# Patient Record
Sex: Female | Born: 1985 | Race: White | Hispanic: No | Marital: Married | Smoking: Former smoker
Health system: Southern US, Community
[De-identification: ages and names within clinical notes are randomized; demographics above are authoritative.]

## PROBLEM LIST (undated history)

## (undated) ENCOUNTER — Inpatient Hospital Stay (HOSPITAL_COMMUNITY): Payer: Self-pay

## (undated) DIAGNOSIS — O4420 Partial placenta previa NOS or without hemorrhage, unspecified trimester: Secondary | ICD-10-CM

## (undated) DIAGNOSIS — F329 Major depressive disorder, single episode, unspecified: Secondary | ICD-10-CM

## (undated) DIAGNOSIS — D649 Anemia, unspecified: Secondary | ICD-10-CM

## (undated) DIAGNOSIS — O021 Missed abortion: Secondary | ICD-10-CM

## (undated) DIAGNOSIS — R011 Cardiac murmur, unspecified: Secondary | ICD-10-CM

## (undated) DIAGNOSIS — S069XAA Unspecified intracranial injury with loss of consciousness status unknown, initial encounter: Secondary | ICD-10-CM

## (undated) DIAGNOSIS — R0602 Shortness of breath: Secondary | ICD-10-CM

## (undated) DIAGNOSIS — S069X9A Unspecified intracranial injury with loss of consciousness of unspecified duration, initial encounter: Secondary | ICD-10-CM

## (undated) DIAGNOSIS — J45909 Unspecified asthma, uncomplicated: Secondary | ICD-10-CM

## (undated) DIAGNOSIS — F32A Depression, unspecified: Secondary | ICD-10-CM

## (undated) DIAGNOSIS — J939 Pneumothorax, unspecified: Secondary | ICD-10-CM

## (undated) HISTORY — PX: ANTERIOR CRUCIATE LIGAMENT REPAIR: SHX115

## (undated) HISTORY — PX: OTHER SURGICAL HISTORY: SHX169

---

## 2012-04-14 ENCOUNTER — Emergency Department (HOSPITAL_COMMUNITY): Payer: Self-pay

## 2012-04-14 ENCOUNTER — Emergency Department (HOSPITAL_COMMUNITY)
Admission: EM | Admit: 2012-04-14 | Discharge: 2012-04-14 | Disposition: A | Discharge status: Home / Self Care | Payer: Self-pay | Attending: Emergency Medicine | Admitting: Emergency Medicine

## 2012-04-14 ENCOUNTER — Encounter (HOSPITAL_COMMUNITY): Payer: Self-pay

## 2012-04-14 DIAGNOSIS — R102 Pelvic and perineal pain: Secondary | ICD-10-CM

## 2012-04-14 DIAGNOSIS — N949 Unspecified condition associated with female genital organs and menstrual cycle: Secondary | ICD-10-CM | POA: Insufficient documentation

## 2012-04-14 LAB — PREGNANCY, URINE: Preg Test, Ur: NEGATIVE

## 2012-04-14 LAB — URINALYSIS, ROUTINE W REFLEX MICROSCOPIC
Bilirubin Urine: NEGATIVE
Ketones, ur: NEGATIVE mg/dL
Leukocytes, UA: NEGATIVE
Nitrite: NEGATIVE
Specific Gravity, Urine: 1.025 (ref 1.005–1.030)
Urobilinogen, UA: 0.2 mg/dL (ref 0.0–1.0)

## 2012-04-14 MED ORDER — IBUPROFEN 800 MG PO TABS: 800.0000 mg | ORAL_TABLET | Freq: Three times a day (TID) | ORAL | Status: DC

## 2012-04-14 MED ORDER — SODIUM CHLORIDE 0.9 % IV SOLN
INTRAVENOUS | Status: DC
  Administered 2012-04-14: 03:00:00 via INTRAVENOUS

## 2012-04-14 MED ORDER — ONDANSETRON HCL 4 MG/2ML IJ SOLN
4.0000 mg | Freq: Once | INTRAMUSCULAR | Status: AC
  Administered 2012-04-14: 4 mg via INTRAVENOUS
  Filled 2012-04-14: qty 2

## 2012-04-14 MED ORDER — HYDROMORPHONE HCL PF 1 MG/ML IJ SOLN
1.0000 mg | Freq: Once | INTRAMUSCULAR | Status: AC
  Administered 2012-04-14: 1 mg via INTRAVENOUS
  Filled 2012-04-14: qty 1

## 2012-04-14 MED ORDER — METRONIDAZOLE 500 MG PO TABS: 500.0000 mg | ORAL_TABLET | Freq: Two times a day (BID) | ORAL | Status: DC

## 2012-04-14 MED ORDER — HYDROCODONE-ACETAMINOPHEN 5-500 MG PO TABS: 1.0000 | ORAL_TABLET | Freq: Four times a day (QID) | ORAL | Status: DC | PRN

## 2012-04-14 NOTE — ED Provider Notes (Signed)
History     CSN: [id number]  Arrival date & time 04/14/12  0114   First MD Initiated Contact with Patient 04/14/12 0203      Chief Complaint  Patient presents with  . Abdominal Pain    (Consider location/radiation/quality/duration/timing/severity/associated sxs/prior treatment) HPI Hx per PT. For the last 3 weeks having on and off pelvic pain described as like my insides are being ripped out. No dysuria, no h/o same, has IUD for the last 3 years, denies any vag bleeding or discharge is G1P1, no h/o endometriosis or interstitial cystitis. No lateralizing pain. Tonight became worse and presents here for evaluation. Pain mod to severe and not radiating.  History reviewed. No pertinent past medical history.  Past Surgical History  Procedure Date  . Titanium rod in right femur     No family history on file.  History  Substance Use Topics  . Smoking status: Never Smoker   . Smokeless tobacco: Not on file  . Alcohol Use: Yes     social    OB History    Grav Para Term Preterm Abortions TAB SAB Ect Mult Living                  Review of Systems  Constitutional: Negative for fever and chills.  HENT: Negative for neck pain and neck stiffness.   Eyes: Negative for pain.  Respiratory: Negative for shortness of breath.   Cardiovascular: Negative for chest pain.  Gastrointestinal: Negative for abdominal pain.  Genitourinary: Positive for pelvic pain. Negative for dysuria, vaginal bleeding and vaginal discharge.  Musculoskeletal: Negative for back pain.  Skin: Negative for rash.  Neurological: Negative for headaches.  All other systems reviewed and are negative.    Allergies  Penicillins  Home Medications   Current Outpatient Rx  Name Route Sig Dispense Refill  . LEVONORGESTREL 20 MCG/24HR IU IUD Intrauterine 1 each by Intrauterine route once.      BP 102/65  Pulse 60  Temp 98.2 F (36.8 C) (Oral)  Resp 20  Ht 5\' 3"  (1.6 m)  Wt 135 lb (61.236 kg)  BMI 23.91  kg/m2  SpO2 100%  LMP 03/20/2012  Physical Exam  Constitutional: She is oriented to person, place, and time. She appears well-developed and well-nourished.  HENT:  Head: Normocephalic and atraumatic.  Eyes: EOM are normal. Pupils are equal, round, and reactive to light.  Neck: Neck supple.  Cardiovascular: Regular rhythm and intact distal pulses.   Pulmonary/Chest: Effort normal. No respiratory distress.  Abdominal: Soft. Bowel sounds are normal. She exhibits no distension and no mass. There is no rebound and no guarding.       No CVAT, is tender midline suprapubic. No peritonitis  Musculoskeletal: Normal range of motion. She exhibits no edema.  Neurological: She is alert and oriented to person, place, and time.  Skin: Skin is warm and dry.    ED Course  Pelvic exam Date/Time: 04/14/2012 5:00 AM Performed by: Sunnie Nielsen Authorized by: Sunnie Nielsen Consent: Verbal consent obtained. Risks and benefits: risks, benefits and alternatives were discussed Consent given by: patient Patient understanding: patient states understanding of the procedure being performed Patient consent: the patient's understanding of the procedure matches consent given Procedure consent: procedure consent matches procedure scheduled Required items: required blood products, implants, devices, and special equipment available Patient identity confirmed: verbally with patient Time out: Immediately prior to procedure a "time out" was called to verify the correct patient, procedure, equipment, support staff and site/side marked as required.  Preparation: Patient was prepped and draped in the usual sterile fashion. Patient tolerance: Patient tolerated the procedure well with no immediate complications. Comments: Normal external GU. No rash or lesions. No CMT, mild white discharge no bleeding or adnexal temderness.   (including critical care time)  Results for orders placed during the hospital encounter of 04/14/12    PREGNANCY, URINE      Component Value Range   Preg Test, Ur NEGATIVE  NEGATIVE  URINALYSIS, ROUTINE W REFLEX MICROSCOPIC      Component Value Range   Color, Urine YELLOW  YELLOW   APPearance CLOUDY (*) CLEAR   Specific Gravity, Urine 1.025  1.005 - 1.030   pH 7.5  5.0 - 8.0   Glucose, UA NEGATIVE  NEGATIVE mg/dL   Hgb urine dipstick NEGATIVE  NEGATIVE   Bilirubin Urine NEGATIVE  NEGATIVE   Ketones, ur NEGATIVE  NEGATIVE mg/dL   Protein, ur NEGATIVE  NEGATIVE mg/dL   Urobilinogen, UA 0.2  0.0 - 1.0 mg/dL   Nitrite NEGATIVE  NEGATIVE   Leukocytes, UA NEGATIVE  NEGATIVE  WET PREP, GENITAL      Component Value Range   Yeast Wet Prep HPF POC NONE SEEN  NONE SEEN   Trich, Wet Prep NONE SEEN  NONE SEEN   Clue Cells Wet Prep HPF POC FEW (*) NONE SEEN   WBC, Wet Prep HPF POC FEW (*) NONE SEEN   US Transvaginal Non-ob  04/14/2012  *RADIOLOGY REPORT*  Clinical Data: Back pain.  Pelvic pain.  TRANSABDOMINAL AND TRANSVAGINAL ULTRASOUND OF PELVIS Technique:  Both transabdominal and transvaginal ultrasound examinations of the pelvis were performed. Transabdominal technique was performed for global imaging of the pelvis including uterus, ovaries, adnexal regions, and pelvic cul-de-sac.  It was necessary to proceed with endovaginal exam following the transabdominal exam to visualize the IUD and adnexa.  Comparison:  None  Findings:  Uterus: Normal uterus.  7.5 cm x 3.3 x 5.5 cm.  Normal myometrial echotexture.  Endometrium: IUD.  Endometrial thickness 3 mm.  Right ovary:  32 mm x 22 mm x 23 mm within normal physiologic appearance.  Left ovary: Not visualized due to bowel gas and bowel loop interposed.  Other findings: No free fluid  IMPRESSION: Normal study aside from nonvisualization of the left ovary due to overlying bowel gas.  The IUD is well positioned in the uterus.   Original Report Authenticated By: Andreas Newport, M.D.    US Pelvis Complete  04/14/2012  *RADIOLOGY REPORT*  Clinical Data: Back  pain.  Pelvic pain.  TRANSABDOMINAL AND TRANSVAGINAL ULTRASOUND OF PELVIS Technique:  Both transabdominal and transvaginal ultrasound examinations of the pelvis were performed. Transabdominal technique was performed for global imaging of the pelvis including uterus, ovaries, adnexal regions, and pelvic cul-de-sac.  It was necessary to proceed with endovaginal exam following the transabdominal exam to visualize the IUD and adnexa.  Comparison:  None  Findings:  Uterus: Normal uterus.  7.5 cm x 3.3 x 5.5 cm.  Normal myometrial echotexture.  Endometrium: IUD.  Endometrial thickness 3 mm.  Right ovary:  32 mm x 22 mm x 23 mm within normal physiologic appearance.  Left ovary: Not visualized due to bowel gas and bowel loop interposed.  Other findings: No free fluid  IMPRESSION: Normal study aside from nonvisualization of the left ovary due to overlying bowel gas.  The IUD is well positioned in the uterus.   Original Report Authenticated By: Andreas Newport, M.D.    Pain control IV Dilaudid. [place]  for pelvic pain reviewed as above. UA reviewed no UTI. Wet prep few clue cells.  Plan flagyl and GYN follow up. PT states understanding pelvic pain precautions. Stable for d./c home with improved ABD exam: s/nt/nd remains no peritonitis  MDM   Otherwise healthy female with pelvic pain and work up as above. [place]. UA. Wet prep. Pending GC/Chlamydia. Improved with IV narcotics. VS and nursing notes reviewed.         Sunnie Nielsen, MD 04/14/12 [phone]

## 2012-04-14 NOTE — ED Notes (Signed)
Pt states she has been having crampy pelvic pain intermittently over the last 2 weeks-states at midnight tonight, she developed severe cramping in her pelvic area and states "it felt like someone was tearing my insides out"-denies any bleeding.  LMP at the beginning of the month was normal.  Has a Murena in for the past 3 years.  States pain intermittently with urination.

## 2012-04-14 NOTE — ED Notes (Signed)
Pt set up for pelvic. Dr. Dierdre Highman notified

## 2012-04-15 LAB — GC/CHLAMYDIA PROBE AMP, GENITAL: Chlamydia, DNA Probe: NEGATIVE

## 2013-08-23 ENCOUNTER — Inpatient Hospital Stay (HOSPITAL_COMMUNITY)
Admission: AD | Admit: 2013-08-23 | Discharge: 2013-08-23 | Disposition: A | Discharge status: Home / Self Care | Payer: 59 | Source: Ambulatory Visit | Attending: Obstetrics & Gynecology | Admitting: Obstetrics & Gynecology

## 2013-08-23 ENCOUNTER — Encounter (HOSPITAL_COMMUNITY): Payer: Self-pay | Admitting: *Deleted

## 2013-08-23 DIAGNOSIS — Z3200 Encounter for pregnancy test, result unknown: Secondary | ICD-10-CM | POA: Insufficient documentation

## 2013-08-23 DIAGNOSIS — R509 Fever, unspecified: Secondary | ICD-10-CM | POA: Insufficient documentation

## 2013-08-23 DIAGNOSIS — R0602 Shortness of breath: Secondary | ICD-10-CM | POA: Insufficient documentation

## 2013-08-23 DIAGNOSIS — R51 Headache: Secondary | ICD-10-CM | POA: Insufficient documentation

## 2013-08-23 DIAGNOSIS — Z3201 Encounter for pregnancy test, result positive: Secondary | ICD-10-CM

## 2013-08-23 HISTORY — DX: Cardiac murmur, unspecified: R01.1

## 2013-08-23 HISTORY — DX: Unspecified intracranial injury with loss of consciousness of unspecified duration, initial encounter: S06.9X9A

## 2013-08-23 HISTORY — DX: Pneumothorax, unspecified: J93.9

## 2013-08-23 HISTORY — DX: Unspecified intracranial injury with loss of consciousness status unknown, initial encounter: S06.9XAA

## 2013-08-23 LAB — URINALYSIS, ROUTINE W REFLEX MICROSCOPIC
BILIRUBIN URINE: NEGATIVE
GLUCOSE, UA: NEGATIVE mg/dL
HGB URINE DIPSTICK: NEGATIVE
KETONES UR: NEGATIVE mg/dL
Leukocytes, UA: NEGATIVE
Nitrite: NEGATIVE
PROTEIN: NEGATIVE mg/dL
Specific Gravity, Urine: >1.03 — ABNORMAL HIGH (ref 1.005–1.030)
UROBILINOGEN UA: 0.2 mg/dL (ref 0.0–1.0)
pH: 6 (ref 5.0–8.0)

## 2013-08-23 LAB — POCT PREGNANCY, URINE: Preg Test, Ur: POSITIVE — AB

## 2013-08-23 NOTE — MAU Note (Signed)
Pt states she had a sore throat this a.m., not now.  Denies any cough.

## 2013-08-23 NOTE — MAU Note (Signed)
Pt reports she has had a "fever" for the past 3 days. Has not taken temp but husband stated she has "felt hot" off and on for the past 3 days. Has has occasional chills.

## 2013-08-23 NOTE — Discharge Instructions (Signed)
Pregnancy, First Trimester °The first trimester is the first 3 months your baby is growing inside you. It is important to follow your doctor's instructions. °HOME CARE  °· Do not smoke. °· Do not drink alcohol. °· Only take medicine as told by your doctor. °· Exercise. °· Eat healthy foods. Eat regular, well-balanced meals. °· You can have sex (intercourse) if there are no other problems with the pregnancy. °· Things that help with morning sickness: °· Eat soda crackers before getting up in the morning. °· Eat 4 to 5 small meals rather than 3 large meals. °· Drink liquids between meals, not during meals. °· Go to all appointments as told. °· Take all vitamins or supplements as told by your doctor. °GET HELP RIGHT AWAY IF:  °· You develop a fever. °· You have a bad smelling fluid that is leaking from your vagina. °· There is bleeding from the vagina. °· You develop severe belly (abdominal) or back pain. °· You throw up (vomit) blood. It may look like coffee grounds. °· You lose more than 2 pounds in a week. °· You gain 5 pounds or more in a week. °· You gain more than 2 pounds in a week and you see puffiness (swelling) in your feet, ankles, or legs. °· You have severe dizziness or pass out (faint). °· You are around people who have German measles, chickenpox, or fifth disease. °· You have a headache, watery poop (diarrhea), pain with peeing (urinating), or cannot breath right. °Document Released: 12/22/2007 Document Revised: 09/27/2011 Document Reviewed: 12/22/2007 °ExitCare® Patient Information ©2014 [employer]. ° °
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
## 2013-08-28 ENCOUNTER — Ambulatory Visit: Payer: 59

## 2013-09-13 ENCOUNTER — Encounter (HOSPITAL_COMMUNITY): Payer: Self-pay

## 2013-09-13 ENCOUNTER — Inpatient Hospital Stay (HOSPITAL_COMMUNITY): Payer: 59

## 2013-09-13 ENCOUNTER — Inpatient Hospital Stay (HOSPITAL_COMMUNITY)
Admission: AD | Admit: 2013-09-13 | Discharge: 2013-09-14 | Disposition: A | Discharge status: Home / Self Care | Payer: 59 | Source: Ambulatory Visit | Attending: Obstetrics and Gynecology | Admitting: Obstetrics and Gynecology

## 2013-09-13 DIAGNOSIS — R109 Unspecified abdominal pain: Secondary | ICD-10-CM | POA: Insufficient documentation

## 2013-09-13 DIAGNOSIS — O209 Hemorrhage in early pregnancy, unspecified: Secondary | ICD-10-CM | POA: Insufficient documentation

## 2013-09-13 NOTE — MAU Note (Signed)
Pt reports vaginal bleeding since 6pm, mild cramping
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
## 2013-09-14 ENCOUNTER — Inpatient Hospital Stay (HOSPITAL_COMMUNITY): Payer: 59

## 2013-09-14 DIAGNOSIS — O209 Hemorrhage in early pregnancy, unspecified: Secondary | ICD-10-CM

## 2013-09-14 LAB — GC/CHLAMYDIA PROBE AMP
CT PROBE, AMP APTIMA: NEGATIVE
GC Probe RNA: NEGATIVE

## 2013-09-14 LAB — URINALYSIS, ROUTINE W REFLEX MICROSCOPIC
BILIRUBIN URINE: NEGATIVE
GLUCOSE, UA: NEGATIVE mg/dL
KETONES UR: NEGATIVE mg/dL
Leukocytes, UA: NEGATIVE
Nitrite: NEGATIVE
PH: 6 (ref 5.0–8.0)
Protein, ur: NEGATIVE mg/dL
SPECIFIC GRAVITY, URINE: 1.025 (ref 1.005–1.030)
Urobilinogen, UA: 0.2 mg/dL (ref 0.0–1.0)

## 2013-09-14 LAB — URINE MICROSCOPIC-ADD ON

## 2013-09-14 LAB — CBC
HEMATOCRIT: 35.6 % — AB (ref 36.0–46.0)
HEMOGLOBIN: 11.6 g/dL — AB (ref 12.0–15.0)
MCH: 30.8 pg (ref 26.0–34.0)
MCHC: 32.6 g/dL (ref 30.0–36.0)
MCV: 94.4 fL (ref 78.0–100.0)
Platelets: 258 10*3/uL (ref 150–400)
RBC: 3.77 MIL/uL — AB (ref 3.87–5.11)
RDW: 13.3 % (ref 11.5–15.5)
WBC: 7 10*3/uL (ref 4.0–10.5)

## 2013-09-14 LAB — WET PREP, GENITAL
CLUE CELLS WET PREP: NONE SEEN
Trich, Wet Prep: NONE SEEN
Yeast Wet Prep HPF POC: NONE SEEN

## 2013-09-14 LAB — HCG, QUANTITATIVE, PREGNANCY: hCG, Beta Chain, Quant, S: 3549 m[IU]/mL — ABNORMAL HIGH (ref <5)

## 2013-09-14 NOTE — Discharge Instructions (Signed)
Vaginal Bleeding During Pregnancy, First Trimester °A small amount of bleeding (spotting) from the vagina is relatively common in early pregnancy. It usually stops on its own. Various things may cause bleeding or spotting in early pregnancy. Some bleeding may be related to the pregnancy, and some may not. In most cases, the bleeding is normal and is not a problem. However, bleeding can also be a sign of something serious. Be sure to tell your health care provider about any vaginal bleeding right away. °Some possible causes of vaginal bleeding during the first trimester include: °· Infection or inflammation of the cervix. °· Growths (polyps) on the cervix. °· Miscarriage or threatened miscarriage. °· Pregnancy tissue has developed outside of the uterus and in a fallopian tube (tubal pregnancy). °· Tiny cysts have developed in the uterus instead of pregnancy tissue (molar pregnancy). °HOME CARE INSTRUCTIONS  °Watch your condition for any changes. The following actions may help to lessen any discomfort you are feeling: °· Follow your health care provider's instructions for limiting your activity. If your health care provider orders bed rest, you may need to stay in bed and only get up to use the bathroom. However, your health care provider may allow you to continue light activity. °· If needed, make plans for someone to help with your regular activities and responsibilities while you are on bed rest. °· Keep track of the number of pads you use each [place], how often you change pads, and how soaked (saturated) they are. Write this down. °· Do not use tampons. Do not douche. °· Do not have sexual intercourse or orgasms until approved by your health care provider. °· If you pass any tissue from your vagina, save the tissue so you can show it to your health care provider. °· Only take over-the-counter or prescription medicines as directed by your health care provider. °· Do not take aspirin because it can make you  bleed. °· Keep all follow-up appointments as directed by your health care provider. °SEEK MEDICAL CARE IF: °· You have any vaginal bleeding during any part of your pregnancy. °· You have cramps or labor pains. °SEEK IMMEDIATE MEDICAL CARE IF:  °· You have severe cramps in your back or belly (abdomen). °· You have a fever, not controlled by medicine. °· You pass large clots or tissue from your vagina. °· Your bleeding increases. °· You feel lightheaded or weak, or you have fainting episodes. °· You have chills. °· You are leaking fluid or have a gush of fluid from your vagina. °· You pass out while having a bowel movement. °MAKE SURE YOU: °· Understand these instructions. °· Will watch your condition. °· Will get help right away if you are not doing well or get worse. °Document Released: 04/14/2005 Document Revised: 04/25/2013 Document Reviewed: 03/12/2013 °ExitCare® Patient Information ©2014 [employer]. ° °

## 2013-10-02 ENCOUNTER — Encounter (HOSPITAL_COMMUNITY): Payer: Self-pay

## 2013-10-03 ENCOUNTER — Encounter (HOSPITAL_COMMUNITY): Payer: Self-pay | Admitting: Obstetrics and Gynecology

## 2013-10-03 ENCOUNTER — Encounter: Payer: 59 | Admitting: Family Medicine

## 2013-10-03 DIAGNOSIS — O021 Missed abortion: Secondary | ICD-10-CM

## 2013-10-03 HISTORY — DX: Missed abortion: O02.1
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
## 2013-10-04 ENCOUNTER — Encounter (HOSPITAL_COMMUNITY): Payer: 59 | Admitting: Anesthesiology

## 2013-10-04 ENCOUNTER — Encounter (HOSPITAL_COMMUNITY)
Admission: RE | Disposition: A | Discharge status: Home / Self Care | Payer: Self-pay | Source: Ambulatory Visit | Attending: Obstetrics and Gynecology

## 2013-10-04 ENCOUNTER — Ambulatory Visit (HOSPITAL_COMMUNITY): Payer: 59 | Admitting: Anesthesiology

## 2013-10-04 ENCOUNTER — Encounter (HOSPITAL_COMMUNITY): Payer: Self-pay

## 2013-10-04 ENCOUNTER — Ambulatory Visit (HOSPITAL_COMMUNITY)
Admission: RE | Admit: 2013-10-04 | Discharge: 2013-10-04 | Disposition: A | Discharge status: Home / Self Care | Payer: 59 | Source: Ambulatory Visit | Attending: Obstetrics and Gynecology | Admitting: Obstetrics and Gynecology

## 2013-10-04 DIAGNOSIS — R011 Cardiac murmur, unspecified: Secondary | ICD-10-CM | POA: Insufficient documentation

## 2013-10-04 DIAGNOSIS — R0602 Shortness of breath: Secondary | ICD-10-CM | POA: Insufficient documentation

## 2013-10-04 DIAGNOSIS — O021 Missed abortion: Secondary | ICD-10-CM | POA: Diagnosis present

## 2013-10-04 HISTORY — DX: Missed abortion: O02.1

## 2013-10-04 HISTORY — DX: Depression, unspecified: F32.A

## 2013-10-04 HISTORY — DX: Major depressive disorder, single episode, unspecified: F32.9

## 2013-10-04 HISTORY — PX: DILATION AND EVACUATION: SHX1459

## 2013-10-04 HISTORY — DX: Shortness of breath: R06.02

## 2013-10-04 LAB — CBC
HCT: 38.8 % (ref 36.0–46.0)
HEMOGLOBIN: 13 g/dL (ref 12.0–15.0)
MCH: 31.3 pg (ref 26.0–34.0)
MCHC: 33.5 g/dL (ref 30.0–36.0)
MCV: 93.3 fL (ref 78.0–100.0)
Platelets: 302 10*3/uL (ref 150–400)
RBC: 4.16 MIL/uL (ref 3.87–5.11)
RDW: 13.3 % (ref 11.5–15.5)
WBC: 8 10*3/uL (ref 4.0–10.5)

## 2013-10-04 LAB — ABO/RH: ABO/RH(D): A POS

## 2013-10-04 SURGERY — DILATION AND EVACUATION, UTERUS
Anesthesia: Monitor Anesthesia Care | Site: Uterus

## 2013-10-04 MED ORDER — FENTANYL CITRATE 0.05 MG/ML IJ SOLN
INTRAMUSCULAR | Status: AC
  Filled 2013-10-04: qty 5

## 2013-10-04 MED ORDER — OXYCODONE-ACETAMINOPHEN 5-325 MG PO TABS: 1.0000 | ORAL_TABLET | Freq: Four times a day (QID) | ORAL | Status: DC | PRN

## 2013-10-04 MED ORDER — LACTATED RINGERS IV SOLN: INTRAVENOUS | Status: DC

## 2013-10-04 MED ORDER — LACTATED RINGERS IV SOLN
INTRAVENOUS | Status: DC
  Administered 2013-10-04 (×2): via INTRAVENOUS

## 2013-10-04 MED ORDER — ONDANSETRON HCL 4 MG/2ML IJ SOLN
INTRAMUSCULAR | Status: AC
  Filled 2013-10-04: qty 2

## 2013-10-04 MED ORDER — KETOROLAC TROMETHAMINE 30 MG/ML IJ SOLN: 15.0000 mg | Freq: Once | INTRAMUSCULAR | Status: DC | PRN

## 2013-10-04 MED ORDER — PROPOFOL 10 MG/ML IV EMUL
INTRAVENOUS | Status: AC
  Filled 2013-10-04: qty 40

## 2013-10-04 MED ORDER — MIDAZOLAM HCL 2 MG/2ML IJ SOLN
INTRAMUSCULAR | Status: DC | PRN
  Administered 2013-10-04: 1 mg via INTRAVENOUS
  Administered 2013-10-04: 2 mg via INTRAVENOUS

## 2013-10-04 MED ORDER — LIDOCAINE HCL (CARDIAC) 20 MG/ML IV SOLN
INTRAVENOUS | Status: AC
  Filled 2013-10-04: qty 5

## 2013-10-04 MED ORDER — KETOROLAC TROMETHAMINE 30 MG/ML IJ SOLN
INTRAMUSCULAR | Status: AC
  Filled 2013-10-04: qty 1

## 2013-10-04 MED ORDER — MIDAZOLAM HCL 2 MG/2ML IJ SOLN: 0.5000 mg | Freq: Once | INTRAMUSCULAR | Status: DC | PRN

## 2013-10-04 MED ORDER — FENTANYL CITRATE 0.05 MG/ML IJ SOLN
INTRAMUSCULAR | Status: DC | PRN
  Administered 2013-10-04: 50 ug via INTRAVENOUS
  Administered 2013-10-04 (×4): 25 ug via INTRAVENOUS

## 2013-10-04 MED ORDER — ONDANSETRON HCL 4 MG/2ML IJ SOLN
INTRAMUSCULAR | Status: DC | PRN
  Administered 2013-10-04: 4 mg via INTRAVENOUS

## 2013-10-04 MED ORDER — FENTANYL CITRATE 0.05 MG/ML IJ SOLN
25.0000 ug | INTRAMUSCULAR | Status: DC | PRN
  Administered 2013-10-04: 50 ug via INTRAVENOUS

## 2013-10-04 MED ORDER — DOXYCYCLINE HYCLATE 100 MG PO TABS
ORAL_TABLET | ORAL | Status: AC
  Administered 2013-10-04: 200 mg via ORAL
  Filled 2013-10-04: qty 2

## 2013-10-04 MED ORDER — IBUPROFEN 800 MG PO TABS: 800.0000 mg | ORAL_TABLET | Freq: Three times a day (TID) | ORAL | Status: DC | PRN

## 2013-10-04 MED ORDER — OXYCODONE-ACETAMINOPHEN 5-325 MG PO TABS
ORAL_TABLET | ORAL | Status: AC
  Administered 2013-10-04: 1
  Filled 2013-10-04: qty 1

## 2013-10-04 MED ORDER — MIDAZOLAM HCL 2 MG/2ML IJ SOLN
INTRAMUSCULAR | Status: AC
Start: 2013-10-04 — End: 2013-10-04
  Filled 2013-10-04: qty 2

## 2013-10-04 MED ORDER — KETOROLAC TROMETHAMINE 30 MG/ML IJ SOLN
INTRAMUSCULAR | Status: DC | PRN
  Administered 2013-10-04: 30 mg via INTRAVENOUS

## 2013-10-04 MED ORDER — MEPERIDINE HCL 25 MG/ML IJ SOLN: 6.2500 mg | INTRAMUSCULAR | Status: DC | PRN

## 2013-10-04 MED ORDER — LIDOCAINE HCL (CARDIAC) 20 MG/ML IV SOLN
INTRAVENOUS | Status: DC | PRN
  Administered 2013-10-04: 60 mg via INTRAVENOUS

## 2013-10-04 MED ORDER — OXYCODONE-ACETAMINOPHEN 5-325 MG PO TABS: 1.0000 | ORAL_TABLET | ORAL | Status: DC | PRN

## 2013-10-04 MED ORDER — DOXYCYCLINE HYCLATE 100 MG PO TABS
200.0000 mg | ORAL_TABLET | Freq: Once | ORAL | Status: AC
  Administered 2013-10-04: 200 mg via ORAL

## 2013-10-04 MED ORDER — FENTANYL CITRATE 0.05 MG/ML IJ SOLN
INTRAMUSCULAR | Status: AC
  Filled 2013-10-04: qty 2

## 2013-10-04 MED ORDER — MIDAZOLAM HCL 2 MG/2ML IJ SOLN
INTRAMUSCULAR | Status: AC
  Filled 2013-10-04: qty 2

## 2013-10-04 MED ORDER — PROPOFOL INFUSION 10 MG/ML OPTIME
INTRAVENOUS | Status: DC | PRN
  Administered 2013-10-04: 50 ug/kg/min via INTRAVENOUS

## 2013-10-04 MED ORDER — LIDOCAINE HCL 2 % IJ SOLN
INTRAMUSCULAR | Status: AC
  Filled 2013-10-04: qty 20

## 2013-10-04 MED ORDER — LIDOCAINE HCL 2 % IJ SOLN
INTRAMUSCULAR | Status: DC | PRN
  Administered 2013-10-04: 16 mL

## 2013-10-04 MED ORDER — PROMETHAZINE HCL 25 MG/ML IJ SOLN: 6.2500 mg | INTRAMUSCULAR | Status: DC | PRN

## 2013-10-04 SURGICAL SUPPLY — 19 items
CATH ROBINSON RED A/P 16FR (CATHETERS) ×4 IMPLANT
CLOTH BEACON ORANGE TIMEOUT ST (SAFETY) ×4 IMPLANT
DECANTER SPIKE VIAL GLASS SM (MISCELLANEOUS) ×4 IMPLANT
DRSG TELFA 3X8 NADH (GAUZE/BANDAGES/DRESSINGS) ×4 IMPLANT
GLOVE BIO SURGEON STRL SZ 6.5 (GLOVE) ×3 IMPLANT
GLOVE BIO SURGEON STRL SZ7 (GLOVE) ×4 IMPLANT
GLOVE BIO SURGEONS STRL SZ 6.5 (GLOVE) ×1
GLOVE BIOGEL PI IND STRL 6.5 (GLOVE) ×4 IMPLANT
GLOVE BIOGEL PI INDICATOR 6.5 (GLOVE) ×4
GLOVE ECLIPSE 6.0 STRL STRAW (GLOVE) ×8 IMPLANT
GLOVE SURG SS PI 7.0 STRL IVOR (GLOVE) ×20 IMPLANT
GOWN STRL REUS W/TWL LRG LVL3 (GOWN DISPOSABLE) ×12 IMPLANT
NEEDLE SPNL 22GX3.5 QUINCKE BK (NEEDLE) ×4 IMPLANT
PACK VAGINAL MINOR WOMEN LF (CUSTOM PROCEDURE TRAY) ×4 IMPLANT
PAD OB MATERNITY 4.3X12.25 (PERSONAL CARE ITEMS) ×4 IMPLANT
PAD PREP 24X48 CUFFED NSTRL (MISCELLANEOUS) ×4 IMPLANT
SYR CONTROL 10ML LL (SYRINGE) ×4 IMPLANT
TOWEL OR 17X24 6PK STRL BLUE (TOWEL DISPOSABLE) ×8 IMPLANT
VACURETTE 7MM CVD STRL WRAP (CANNULA) ×4 IMPLANT

## 2013-10-04 NOTE — Transfer of Care (Signed)
Immediate Anesthesia Transfer of Care Note  Patient: [name]  Procedure(s) Performed: Procedure(s): DILATATION AND EVACUATION (N/A)  Patient Location: PACU  Anesthesia Type:MAC  Level of Consciousness: awake, alert , oriented and patient cooperative  Airway & Oxygen Therapy: Patient Spontanous Breathing and Patient connected to nasal cannula oxygen  Post-op Assessment: Report given to PACU RN and Post -op Vital signs reviewed and stable  Post vital signs: Reviewed and stable  Complications: No apparent anesthesia complications

## 2013-10-04 NOTE — Interval H&P Note (Signed)
History and Physical Interval Note:  10/04/2013 9:20 AM  [name]  has presented today for surgery, with the diagnosis of PROC, [id number]  The various methods of treatment have been discussed with the patient and family. After consideration of risks, benefits and other options for treatment, the patient has consented to  Procedure(s): DILATATION AND CURETTAGE (N/A) as a surgical intervention .  The patient's history has been reviewed, patient examined, no change in status, stable for surgery.  I have reviewed the patient's chart and labs.  Questions were answered to the patient's satisfaction.     BOVARD,Annamarie Yamaguchi   

## 2013-10-04 NOTE — Anesthesia Postprocedure Evaluation (Signed)
  Anesthesia Post Note  Patient: [name]  Procedure(s) Performed: Procedure(s) (LRB): DILATATION AND EVACUATION (N/A)  Anesthesia type: MAC  Patient location: PACU  Post pain: Pain level controlled  Post assessment: Post-op Vital signs reviewed  Last Vitals:  Filed Vitals:   10/04/13 1054  BP:   Pulse: 79  Temp: 36.5 C  Resp: 12    Post vital signs: Reviewed  Level of consciousness: sedated  Complications: No apparent anesthesia complications,a

## 2013-10-04 NOTE — Anesthesia Preprocedure Evaluation (Addendum)
Anesthesia Evaluation  Patient identified by MRN, date of birth, ID band Patient awake    Reviewed: Allergy & Precautions, H&P , Patient's Chart, lab work & pertinent test results, reviewed documented beta blocker date and time   History of Anesthesia Complications Negative for: history of anesthetic complications  Airway Mallampati: II TM Distance: >3 FB Neck ROM: full    Dental   Pulmonary shortness of breath,  breath sounds clear to auscultation        Cardiovascular Exercise Tolerance: Good + Valvular Problems/Murmurs Rhythm:regular Rate:Normal + Systolic murmurs    Neuro/Psych PSYCHIATRIC DISORDERS negative psych ROS   GI/Hepatic   Endo/Other    Renal/GU      Musculoskeletal   Abdominal   Peds  Hematology   Anesthesia Other Findings   Reproductive/Obstetrics                           Anesthesia Physical Anesthesia Plan  ASA: II  Anesthesia Plan: MAC   Post-op Pain Management:    Induction:   Airway Management Planned:   Additional Equipment:   Intra-op Plan:   Post-operative Plan:   Informed Consent: I have reviewed the patients History and Physical, chart, labs and discussed the procedure including the risks, benefits and alternatives for the proposed anesthesia with the patient or authorized representative who has indicated his/her understanding and acceptance.   Dental Advisory Given  Plan Discussed with: CRNA, Surgeon and Anesthesiologist  Anesthesia Plan Comments:        very very mild flow murmur.  Good exercise tolerance.  Anesthesia Quick Evaluation

## 2013-10-04 NOTE — Discharge Instructions (Signed)
DISCHARGE INSTRUCTIONS: D&C / D&E °The following instructions have been prepared to help you care for yourself upon your return home. °  °Personal hygiene: °• Use sanitary pads for vaginal drainage, not tampons. °• Shower the [place] after your procedure. °• NO tub baths, pools or Jacuzzis for 2-3 weeks. °• Wipe front to back after using the bathroom. ° °Activity and limitations: °• Do NOT drive or operate any equipment for 24 hours. The effects of anesthesia are still present and drowsiness may result. °• Do NOT rest in bed all Wall. °• Walking is encouraged. °• Walk up and down stairs slowly. °• You may resume your normal activity in one to two days or as indicated by your physician. ° °Sexual activity: NO intercourse for at least 2 weeks after the procedure, or as indicated by your physician. ° °Diet: Eat a light meal as desired this evening. You may resume your usual diet tomorrow. ° °Return to work: You may resume your work activities in one to two days or as indicated by your doctor. ° °What to expect after your surgery: Expect to have vaginal bleeding/discharge for 2-3 days and spotting for up to 10 days. It is not unusual to have soreness for up to 1-2 weeks. You may have a slight burning sensation when you urinate for the first [place]. Mild cramps may continue for a couple of days. You may have a regular period in 2-6 weeks. ° °Call your doctor for any of the following: °• Excessive vaginal bleeding, saturating and changing one pad every hour. °• Inability to urinate 6 hours after discharge from hospital. °• Pain not relieved by pain medication. °• Fever of 100.4° F or greater. °• Unusual vaginal discharge or odor. ° ° Call for an appointment:  ° ° °Patient’s signature: ______________________ ° °Nurse’s signature ________________________ ° °Support person's signature_______________________ ° ° ° °

## 2013-10-04 NOTE — Interval H&P Note (Signed)
History and Physical Interval Note:  10/04/2013 9:20 AM  Shanda BumpsJessica Bauman  has presented today for surgery, with the diagnosis of PROC, [phone]  The various methods of treatment have been discussed with the patient and family. After consideration of risks, benefits and other options for treatment, the patient has consented to  Procedure(s): DILATATION AND CURETTAGE (N/A) as a surgical intervention .  The patient's history has been reviewed, patient examined, no change in status, stable for surgery.  I have reviewed the patient's chart and labs.  Questions were answered to the patient's satisfaction.     BOVARD,Allaina Brotzman

## 2013-10-04 NOTE — Op Note (Signed)
NAMIrwin Brakeman:  Lewis, Mariah                 ACCOUNT NO.:  [id number]  MEDICAL RECORD NO.:  [id number]  LOCATION:  [facility]                          F[facility]ILITY:  [facility]  PHYSICIAN:  Sherron MondayJody Bovard, MD        DATE OF BIRTH:  Sep 18, 1985  DATE OF PROCEDURE:  10/04/2013 DATE OF DISCHARGE:                              OPERATIVE REPORT   PREOPERATIVE DIAGNOSIS:  Retained products of conception after missed abortion with pelvic pain.  POSTOPERATIVE DIAGNOSIS:  Retained products of conception after missed abortion with pelvic pain.  PROCEDURE:  Suction, D and E.  SURGEON:  Sherron MondayJody Bovard, MD.  ANESTHESIA:  IV sedation and paracervical block.  Local anesthetic, paracervical block with 2% lidocaine 16 mL at 12, 5, and 7 o'clock.  ESTIMATED BLOOD LOSS:  Less than 50 mL.  PATHOLOGY:  Products of conception.  COMPLICATIONS:  None.  PROCEDURE IN DETAIL:  After informed consent was reviewed with the patient and her partner, she was transported to the OR, placed on the table in supine position.  General anesthesia was induced and found to be adequate.  IV sedation was induced and found to be adequate.  She was then placed in the elephant stirrups, prepped and draped in the normal sterile fashion.  Her bladder was sterilely drained.  An open-sided speculum was used to easily visualize her cervix.  The anterior lip was grasped with a single-tooth tenaculum and 16 mL of 2% lidocaine were used for paracervical block as described above.  The uterus were sounded to approximately 6.5 cm.  The cervix did not require dilatation.  A 7- French curette was placed without complication.  Curettage was performed with a suction curette.  Her uterus was approximately 3 passes yielding a small amount of tissue.  The patient tolerated the procedure well, was awakened in stable condition.  Sponge, lap, and needle counts were correct x2 per the operating room staff.     Sherron MondayJody Bovard, MD     JB/MEDQ  D:  10/04/2013  T:   10/04/2013  Job:  [id number]

## 2013-10-04 NOTE — Brief Op Note (Signed)
10/04/2013  10:01 AM  PATIENT:  [name]  28 y.o. female  PRE-OPERATIVE DIAGNOSIS:  retained products after MAB, with pain  POST-OPERATIVE DIAGNOSIS:  retained products after MAB, with pain  PROCEDURE:  Procedure(s): DILATATION AND CURETTAGE with suction  (N/A)  SURGEON:  Surgeon(s) and Role:    * Xzavior Reinig Bovard, MD - Primary  ANESTHESIA:   IV sedation and paracervical block  EBL:   <50cc, min  BLOOD ADMINISTERED:none  DRAINS: none   LOCAL MEDICATIONS USED:  LIDOCAINE 2% and Amount: 16 ml  SPECIMEN:  Source of Specimen:  POC  DISPOSITION OF SPECIMEN:  PATHOLOGY  COUNTS:  YES  TOURNIQUET:  * No tourniquets in log *  DICTATION: .Other Dictation: Dictation Number [id number]  PLAN OF CARE: Discharge to home after PACU  PATIENT DISPOSITION:  PACU - hemodynamically stable.   Delay start of Pharmacological VTE agent (>24hrs) due to surgical blood loss or risk of bleeding: not applicable

## 2013-10-05 ENCOUNTER — Encounter (HOSPITAL_COMMUNITY): Payer: Self-pay | Admitting: Obstetrics and Gynecology

## 2013-10-10 ENCOUNTER — Encounter (HOSPITAL_COMMUNITY): Payer: Self-pay | Admitting: *Deleted

## 2013-10-10 ENCOUNTER — Inpatient Hospital Stay (HOSPITAL_COMMUNITY)
Admission: AD | Admit: 2013-10-10 | Discharge: 2013-10-10 | Disposition: A | Discharge status: Home / Self Care | Payer: 59 | Source: Ambulatory Visit | Attending: Obstetrics and Gynecology | Admitting: Obstetrics and Gynecology

## 2013-10-10 ENCOUNTER — Inpatient Hospital Stay (HOSPITAL_COMMUNITY): Payer: 59

## 2013-10-10 DIAGNOSIS — R071 Chest pain on breathing: Secondary | ICD-10-CM | POA: Insufficient documentation

## 2013-10-10 DIAGNOSIS — R0602 Shortness of breath: Secondary | ICD-10-CM | POA: Insufficient documentation

## 2013-10-10 DIAGNOSIS — R062 Wheezing: Secondary | ICD-10-CM | POA: Insufficient documentation

## 2013-10-10 DIAGNOSIS — J45909 Unspecified asthma, uncomplicated: Secondary | ICD-10-CM

## 2013-10-10 DIAGNOSIS — R0781 Pleurodynia: Secondary | ICD-10-CM

## 2013-10-10 LAB — CBC
HCT: 37.5 % (ref 36.0–46.0)
HEMOGLOBIN: 12.3 g/dL (ref 12.0–15.0)
MCH: 31.1 pg (ref 26.0–34.0)
MCHC: 32.8 g/dL (ref 30.0–36.0)
MCV: 94.7 fL (ref 78.0–100.0)
PLATELETS: 302 10*3/uL (ref 150–400)
RBC: 3.96 MIL/uL (ref 3.87–5.11)
RDW: 13.2 % (ref 11.5–15.5)
WBC: 8.3 10*3/uL (ref 4.0–10.5)

## 2013-10-10 MED ORDER — FLUTICASONE PROPIONATE HFA 44 MCG/ACT IN AERO: 2.0000 | INHALATION_SPRAY | Freq: Two times a day (BID) | RESPIRATORY_TRACT | Status: DC

## 2013-10-10 MED ORDER — IPRATROPIUM BROMIDE 0.02 % IN SOLN
0.5000 mg | Freq: Once | RESPIRATORY_TRACT | Status: AC
  Administered 2013-10-10: 0.5 mg via RESPIRATORY_TRACT
  Filled 2013-10-10: qty 2.5

## 2013-10-10 MED ORDER — ALBUTEROL SULFATE (2.5 MG/3ML) 0.083% IN NEBU
2.5000 mg | INHALATION_SOLUTION | Freq: Once | RESPIRATORY_TRACT | Status: AC
  Administered 2013-10-10: 2.5 mg via RESPIRATORY_TRACT
  Filled 2013-10-10: qty 3

## 2013-10-10 MED ORDER — KETOROLAC TROMETHAMINE 60 MG/2ML IM SOLN
60.0000 mg | Freq: Once | INTRAMUSCULAR | Status: AC
  Administered 2013-10-10: 60 mg via INTRAMUSCULAR
  Filled 2013-10-10: qty 2

## 2013-10-10 MED ORDER — ALBUTEROL SULFATE HFA 108 (90 BASE) MCG/ACT IN AERS
1.0000 | INHALATION_SPRAY | Freq: Once | RESPIRATORY_TRACT | Status: AC
  Administered 2013-10-10: 2 via RESPIRATORY_TRACT
  Filled 2013-10-10: qty 6.7

## 2013-10-10 NOTE — Discharge Instructions (Signed)
Asthma, Adult Asthma is a recurring condition in which the airways tighten and narrow. Asthma can make it difficult to breathe. It can cause coughing, wheezing, and shortness of breath. Asthma episodes (also called asthma attacks) range from minor to life-threatening. Asthma cannot be cured, but medicines and lifestyle changes can help control it. CAUSES Asthma is believed to be caused by inherited (genetic) and environmental factors, but its exact cause is unknown. Asthma may be triggered by allergens, lung infections, or irritants in the air. Asthma triggers are different for each person. Common triggers include:   Animal dander.  Dust mites.  Cockroaches.  Pollen from trees or grass.  Mold.  Smoke.  Air pollutants such as dust, household cleaners, hair sprays, aerosol sprays, paint fumes, strong chemicals, or strong odors.  Cold air, weather changes, and winds (which increase molds and pollens in the air).  Strong emotional expressions such as crying or laughing hard.  Stress.  Certain medicines (such as aspirin) or types of drugs (such as beta-blockers).  Sulfites in foods and drinks. Foods and drinks that may contain sulfites include dried fruit, potato chips, and sparkling grape juice.  Infections or inflammatory conditions such as the flu, a cold, or an inflammation of the nasal membranes (rhinitis).  Gastroesophageal reflux disease (GERD).  Exercise or strenuous activity. SYMPTOMS Symptoms may occur immediately after asthma is triggered or many hours later. Symptoms include:  Wheezing.  Excessive nighttime or early morning coughing.  Frequent or severe coughing with a common cold.  Chest tightness.  Shortness of breath. DIAGNOSIS  The diagnosis of asthma is made by a review of your medical history and a physical exam. Tests may also be performed. These may include:  Lung function studies. These tests show how much air you breath in and out.  Allergy  tests.  Imaging tests such as X-rays. TREATMENT  Asthma cannot be cured, but it can usually be controlled. Treatment involves identifying and avoiding your asthma triggers. It also involves medicines. There are 2 classes of medicine used for asthma treatment:   Controller medicines. These prevent asthma symptoms from occurring. They are usually taken every [place].  Reliever or rescue medicines. These quickly relieve asthma symptoms. They are used as needed and provide short-term relief. Your health care provider will help you create an asthma action plan. An asthma action plan is a written plan for managing and treating your asthma attacks. It includes a list of your asthma triggers and how they may be avoided. It also includes information on when medicines should be taken and when their dosage should be changed. An action plan may also involve the use of a device called a peak flow meter. A peak flow meter measures how well the lungs are working. It helps you monitor your condition. HOME CARE INSTRUCTIONS   Take medicine as directed by your health care provider. Speak with your health care provider if you have questions about how or when to take the medicines.  Use a peak flow meter as directed by your health care provider. Record and keep track of readings.  Understand and use the action plan to help minimize or stop an asthma attack without needing to seek medical care.  Control your home environment in the following ways to help prevent asthma attacks:  Do not smoke. Avoid being exposed to secondhand smoke.  Change your heating and air conditioning filter regularly.  Limit your use of fireplaces and wood stoves.  Get rid of pests (such as roaches and   mice) and their droppings.  Throw away plants if you see mold on them.  Clean your floors and dust regularly. Use unscented cleaning products.  Try to have someone else vacuum for you regularly. Stay out of rooms while they are being  vacuumed and for a short while afterward. If you vacuum, use a dust mask from a hardware store, a double-layered or microfilter vacuum cleaner bag, or a vacuum cleaner with a HEPA filter.  Replace carpet with wood, tile, or vinyl flooring. Carpet can trap dander and dust.  Use allergy-proof pillows, mattress covers, and box spring covers.  Wash bed sheets and blankets every week in hot water and dry them in a dryer.  Use blankets that are made of polyester or cotton.  Clean bathrooms and kitchens with bleach. If possible, have someone repaint the walls in these rooms with mold-resistant paint. Keep out of the rooms that are being cleaned and painted.  Wash hands frequently. SEEK MEDICAL CARE IF:   You have wheezing, shortness of breath, or a cough even if taking medicine to prevent attacks.  The colored mucus you cough up (sputum) is thicker than usual.  Your sputum changes from clear or white to yellow, green, gray, or bloody.  You have any problems that may be related to the medicines you are taking (such as a rash, itching, swelling, or trouble breathing).  You are using a reliever medicine more than 2 3 times per week.  Your peak flow is still at 50 79% of you personal best after following your action plan for 1 hour. SEEK IMMEDIATE MEDICAL CARE IF:   You seem to be getting worse and are unresponsive to treatment during an asthma attack.  You are short of breath even at rest.  You get short of breath when doing very little physical activity.  You have difficulty eating, drinking, or talking due to asthma symptoms.  You develop chest pain.  You develop a fast heartbeat.  You have a bluish color to your lips or fingernails.  You are lightheaded, dizzy, or faint.  Your peak flow is less than 50% of your personal best.  You have a fever or persistent symptoms for more than 2 3 days.  You have a fever and symptoms suddenly get worse. MAKE SURE YOU:   Understand these  instructions.  Will watch your condition.  Will get help right away if you are not doing well or get worse. Document Released: 07/05/2005 Document Revised: 03/07/2013 Document Reviewed: 02/01/2013 [employer] Patient Information 2014 [employer].  
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
## 2013-10-10 NOTE — Progress Notes (Signed)
RT called for breathing tx. 

## 2013-10-10 NOTE — MAU Note (Signed)
States she had a D&C last week due to miscarriage. States it has hurt to breathe since then.

## 2014-01-11 LAB — OB RESULTS CONSOLE GC/CHLAMYDIA
Chlamydia: NEGATIVE
Gonorrhea: NEGATIVE

## 2014-01-11 LAB — OB RESULTS CONSOLE RUBELLA ANTIBODY, IGM: RUBELLA: IMMUNE

## 2014-01-11 LAB — OB RESULTS CONSOLE HEPATITIS B SURFACE ANTIGEN: HEP B S AG: NEGATIVE

## 2014-01-11 LAB — OB RESULTS CONSOLE HIV ANTIBODY (ROUTINE TESTING): HIV: NONREACTIVE

## 2014-01-11 LAB — OB RESULTS CONSOLE RPR: RPR: NONREACTIVE

## 2014-04-02 ENCOUNTER — Encounter (HOSPITAL_COMMUNITY): Payer: Self-pay

## 2014-04-02 DIAGNOSIS — O26872 Cervical shortening, second trimester: Secondary | ICD-10-CM | POA: Diagnosis present
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
## 2014-04-03 ENCOUNTER — Encounter (HOSPITAL_COMMUNITY): Payer: 59 | Admitting: Anesthesiology

## 2014-04-03 ENCOUNTER — Encounter (HOSPITAL_COMMUNITY): Payer: Self-pay | Admitting: Pharmacy Technician

## 2014-04-03 ENCOUNTER — Encounter (HOSPITAL_COMMUNITY)
Admission: RE | Disposition: A | Discharge status: Home / Self Care | Payer: Self-pay | Source: Ambulatory Visit | Attending: Obstetrics and Gynecology

## 2014-04-03 ENCOUNTER — Encounter (HOSPITAL_COMMUNITY): Payer: Self-pay | Admitting: *Deleted

## 2014-04-03 ENCOUNTER — Ambulatory Visit (HOSPITAL_COMMUNITY): Payer: 59 | Admitting: Anesthesiology

## 2014-04-03 ENCOUNTER — Ambulatory Visit (HOSPITAL_COMMUNITY)
Admission: RE | Admit: 2014-04-03 | Discharge: 2014-04-03 | Disposition: A | Discharge status: Home / Self Care | Payer: 59 | Source: Ambulatory Visit | Attending: Obstetrics and Gynecology | Admitting: Obstetrics and Gynecology

## 2014-04-03 DIAGNOSIS — O26872 Cervical shortening, second trimester: Secondary | ICD-10-CM | POA: Diagnosis present

## 2014-04-03 DIAGNOSIS — O26879 Cervical shortening, unspecified trimester: Secondary | ICD-10-CM | POA: Insufficient documentation

## 2014-04-03 DIAGNOSIS — O3430 Maternal care for cervical incompetence, unspecified trimester: Secondary | ICD-10-CM | POA: Diagnosis not present

## 2014-04-03 HISTORY — DX: Unspecified asthma, uncomplicated: J45.909

## 2014-04-03 HISTORY — PX: CERVICAL CERCLAGE: SHX1329

## 2014-04-03 HISTORY — DX: Anemia, unspecified: D64.9

## 2014-04-03 LAB — CBC
HCT: 33.7 % — ABNORMAL LOW (ref 36.0–46.0)
Hemoglobin: 11.4 g/dL — ABNORMAL LOW (ref 12.0–15.0)
MCH: 31.5 pg (ref 26.0–34.0)
MCHC: 33.8 g/dL (ref 30.0–36.0)
MCV: 93.1 fL (ref 78.0–100.0)
Platelets: 268 10*3/uL (ref 150–400)
RBC: 3.62 MIL/uL — ABNORMAL LOW (ref 3.87–5.11)
RDW: 13.8 % (ref 11.5–15.5)
WBC: 11.7 10*3/uL — AB (ref 4.0–10.5)

## 2014-04-03 SURGERY — CERCLAGE, CERVIX, VAGINAL APPROACH
Anesthesia: Spinal | Site: Vagina

## 2014-04-03 MED ORDER — FENTANYL CITRATE 0.05 MG/ML IJ SOLN: 25.0000 ug | INTRAMUSCULAR | Status: DC | PRN

## 2014-04-03 MED ORDER — MEPERIDINE HCL 25 MG/ML IJ SOLN: 6.2500 mg | INTRAMUSCULAR | Status: DC | PRN

## 2014-04-03 MED ORDER — LACTATED RINGERS IV SOLN
INTRAVENOUS | Status: DC
  Administered 2014-04-03 (×2): via INTRAVENOUS

## 2014-04-03 MED ORDER — FENTANYL CITRATE 0.05 MG/ML IJ SOLN
INTRAMUSCULAR | Status: AC
  Filled 2014-04-03: qty 2

## 2014-04-03 MED ORDER — MORPHINE SULFATE 0.5 MG/ML IJ SOLN
INTRAMUSCULAR | Status: AC
  Filled 2014-04-03: qty 10

## 2014-04-03 MED ORDER — BUPIVACAINE IN DEXTROSE 0.75-8.25 % IT SOLN
INTRATHECAL | Status: DC | PRN
  Administered 2014-04-03: 1 mL via INTRATHECAL

## 2014-04-03 MED ORDER — ONDANSETRON HCL 4 MG/2ML IJ SOLN
INTRAMUSCULAR | Status: DC | PRN
  Administered 2014-04-03: 4 mg via INTRAVENOUS

## 2014-04-03 MED ORDER — PHENYLEPHRINE HCL 10 MG/ML IJ SOLN
INTRAMUSCULAR | Status: DC | PRN
  Administered 2014-04-03 (×2): 40 ug via INTRAVENOUS

## 2014-04-03 MED ORDER — FENTANYL CITRATE 0.05 MG/ML IJ SOLN
INTRAMUSCULAR | Status: DC | PRN
  Administered 2014-04-03: 25 ug via INTRAVENOUS
  Administered 2014-04-03: 50 ug via INTRAVENOUS
  Administered 2014-04-03: 25 ug via INTRAVENOUS

## 2014-04-03 MED ORDER — ONDANSETRON HCL 4 MG/2ML IJ SOLN
INTRAMUSCULAR | Status: AC
  Filled 2014-04-03: qty 2

## 2014-04-03 MED ORDER — SCOPOLAMINE 1 MG/3DAYS TD PT72: 1.0000 | MEDICATED_PATCH | Freq: Once | TRANSDERMAL | Status: DC

## 2014-04-03 MED ORDER — PHENYLEPHRINE 40 MCG/ML (10ML) SYRINGE FOR IV PUSH (FOR BLOOD PRESSURE SUPPORT)
PREFILLED_SYRINGE | INTRAVENOUS | Status: AC
  Filled 2014-04-03: qty 5

## 2014-04-03 SURGICAL SUPPLY — 20 items
CANISTER SUCT 3000ML (MISCELLANEOUS) ×3 IMPLANT
CLOTH BEACON ORANGE TIMEOUT ST (SAFETY) ×3 IMPLANT
COUNTER NEEDLE 1200 MAGNETIC (NEEDLE) ×3 IMPLANT
GLOVE BIO SURGEON STRL SZ 6.5 (GLOVE) ×2 IMPLANT
GLOVE BIO SURGEON STRL SZ7 (GLOVE) ×3 IMPLANT
GLOVE BIO SURGEONS STRL SZ 6.5 (GLOVE) ×1
GOWN STRL REUS W/TWL LRG LVL3 (GOWN DISPOSABLE) ×6 IMPLANT
NEEDLE MAYO .5 CIRCLE (NEEDLE) ×3 IMPLANT
NS IRRIG 1000ML POUR BTL (IV SOLUTION) ×3 IMPLANT
PACK VAGINAL MINOR WOMEN LF (CUSTOM PROCEDURE TRAY) ×3 IMPLANT
PAD OB MATERNITY 4.3X12.25 (PERSONAL CARE ITEMS) ×3 IMPLANT
PAD PREP 24X48 CUFFED NSTRL (MISCELLANEOUS) ×3 IMPLANT
SUT PROLENE 1 CTX 30  8455H (SUTURE) ×4
SUT PROLENE 1 CTX 30 8455H (SUTURE) ×2 IMPLANT
TOWEL OR 17X24 6PK STRL BLUE (TOWEL DISPOSABLE) ×6 IMPLANT
TRAY FOLEY CATH 14FR (SET/KITS/TRAYS/PACK) ×3 IMPLANT
TUBING NON-CON 1/4 X 20 CONN (TUBING) ×2 IMPLANT
TUBING NON-CON 1/4 X 20' CONN (TUBING) ×1
WATER STERILE IRR 1000ML POUR (IV SOLUTION) ×3 IMPLANT
YANKAUER SUCT BULB TIP NO VENT (SUCTIONS) ×3 IMPLANT

## 2014-04-03 NOTE — Anesthesia Preprocedure Evaluation (Signed)
Anesthesia Evaluation  Patient identified by MRN, date of birth, ID band Patient awake    Reviewed: Allergy & Precautions, H&P , NPO status , Patient's Chart, lab work & pertinent test results  Airway Mallampati: I TM Distance: >3 FB Neck ROM: full    Dental no notable dental hx.    Pulmonary    Pulmonary exam normal       Cardiovascular negative cardio ROS      Neuro/Psych    GI/Hepatic negative GI ROS, Neg liver ROS,   Endo/Other  negative endocrine ROS  Renal/GU negative Renal ROS     Musculoskeletal   Abdominal Normal abdominal exam  (+)   Peds  Hematology   Anesthesia Other Findings   Reproductive/Obstetrics (+) Pregnancy                           Anesthesia Physical Anesthesia Plan  ASA: II  Anesthesia Plan: Spinal   Post-op Pain Management:    Induction:   Airway Management Planned:   Additional Equipment:   Intra-op Plan:   Post-operative Plan:   Informed Consent: I have reviewed the patients History and Physical, chart, labs and discussed the procedure including the risks, benefits and alternatives for the proposed anesthesia with the patient or authorized representative who has indicated his/her understanding and acceptance.     Plan Discussed with: CRNA and Surgeon  Anesthesia Plan Comments:         Anesthesia Quick Evaluation

## 2014-04-03 NOTE — Transfer of Care (Signed)
Immediate Anesthesia Transfer of Care Note  Patient: [name]  Procedure(s) Performed: Procedure(s): CERCLAGE CERVICAL (N/A)  Patient Location: PACU  Anesthesia Type:Spinal  Level of Consciousness: awake, alert , oriented and patient cooperative  Airway & Oxygen Therapy: Patient Spontanous Breathing  Post-op Assessment: Report given to PACU RN and Post -op Vital signs reviewed and stable  Post vital signs: Reviewed and stable  Complications: No apparent anesthesia complications

## 2014-04-03 NOTE — Interval H&P Note (Signed)
History and Physical Interval Note:  04/03/2014 11:49 AM  Shanda Bumps [place]  has presented today for surgery, with the diagnosis of incompetent cervix   The various methods of treatment have been discussed with the patient and family. After consideration of risks, benefits and other options for treatment, the patient has consented to  Procedure(s): CERCLAGE CERVICAL (N/A) as a surgical intervention .  The patient's history has been reviewed, patient examined, no change in status, stable for surgery.  I have reviewed the patient's chart and labs.  Questions were answered to the patient's satisfaction.     [name], [place]

## 2014-04-03 NOTE — Brief Op Note (Signed)
04/03/2014  12:47 PM  PATIENT:  [name]  28 y.o. female  PRE-OPERATIVE DIAGNOSIS:  incompetent cervix, IUP at 21wk   POST-OPERATIVE DIAGNOSIS:  incomplete cervix, IUP at 21wk  PROCEDURE:  Procedure(s): CERCLAGE CERVICAL (N/A) knot at 12 o'clock  SURGEON:  Surgeon(s) and Role:    * Allona Gondek [name], MD - Primary  ANESTHESIA:   spinal  EBL:  Total I/O In: 1200 [I.V.:1200] Out: 110 [Urine:100; Blood:10]  BLOOD ADMINISTERED:none  DRAINS: Urinary Catheter (Foley) until discharge (s/p cerclage)   SPECIMEN:  No Specimen  DISPOSITION OF SPECIMEN:  N/A  COUNTS:  YES  TOURNIQUET:  * No tourniquets in log *  DICTATION: .Other Dictation: Dictation Number [id number]  PLAN OF CARE: Discharge to home after PACU  PATIENT DISPOSITION:  PACU - hemodynamically stable.   Delay start of Pharmacological VTE agent (>24hrs) due to surgical blood loss or risk of bleeding: not applicable

## 2014-04-03 NOTE — Anesthesia Procedure Notes (Signed)
Spinal  Patient location during procedure: OR Start time: 04/03/2014 11:59 AM End time: 04/03/2014 12:01 PM Staffing Anesthesiologist: Leilani Able Performed by: anesthesiologist  Preanesthetic Checklist Completed: patient identified, surgical consent, pre-op evaluation, timeout performed, IV checked, risks and benefits discussed and monitors and equipment checked Spinal Block Patient position: sitting Prep: site prepped and draped and DuraPrep Patient monitoring: heart rate, cardiac monitor, continuous pulse ox and blood pressure Approach: midline Location: L3-4 Injection technique: single-shot Needle Needle type: Pencan  Needle gauge: 24 G Needle length: 9 cm Needle insertion depth: 4 cm Assessment Sensory level: T4

## 2014-04-04 ENCOUNTER — Encounter (HOSPITAL_COMMUNITY): Payer: Self-pay | Admitting: Obstetrics and Gynecology

## 2014-04-04 NOTE — Anesthesia Postprocedure Evaluation (Signed)
Anesthesia Post Note  Patient: [name]  Procedure(s) Performed: Procedure(s) (LRB): CERCLAGE CERVICAL (N/A)  Anesthesia type: Spinal  Patient location: PACU  Post pain: Pain level controlled  Post assessment: Post-op Vital signs reviewed  Last Vitals:  Filed Vitals:   04/03/14 1428  BP: 107/64  Pulse: 77  Temp:   Resp: 20    Post vital signs: Reviewed  Level of consciousness: awake  Complications: No apparent anesthesia complications

## 2014-04-04 NOTE — Op Note (Signed)
NAMEKRISTAL, PERL                 ACCOUNT NO.:  [id number]  MEDICAL RECORD NO.:  [id number]  LOCATION:  [facility]                          FACILITY:  [facility]  PHYSICIAN:  Sherron Monday, MD        DATE OF BIRTH:  11-Dec-1985  DATE OF PROCEDURE:  04/03/2014 DATE OF DISCHARGE:  04/03/2014                              OPERATIVE REPORT   PREOPERATIVE DIAGNOSIS:  Intrauterine pregnancy at 21 weeks, incompetent cervix with shortening cervix noted on vaginal ultrasound.  POSTOPERATIVE DIAGNOSIS:  Intrauterine pregnancy at 21 weeks, incompetent cervix with shortening cervix noted on vaginal ultrasound.  PROCEDURE:  McDonald cervical cerclage with knot at 12'o clock.  SURGEON:  Sherron Monday, MD  ANESTHESIA:  Spinal.  ESTIMATED BLOOD LOSS:  Minimal.  IV FLUIDS:  1200 mL.  URINE OUTPUT:  100 mL clear urine at the end of procedure.  COMPLICATIONS:  None.  PATHOLOGY:  None.  DESCRIPTION OF PROCEDURE:  After informed consent was reviewed with the patient and the father of the baby, she was transported to the OR in stable condition, found on the edge of the table and the spinal anesthesia was induced after this was found to be adequate.  She was returned to the supine position, placed in the [place] stirrups, prepped and draped in the normal sterile fashion.  Her external vulva was cleansed with Betadine and the lower third of her vagina as well. Foley catheter was placed.  An open-sided speculum was placed in her vagina.  Her cervix was easily visualized and cleansed with plain saline.  The anterior lip of the cervix was grasped with an open sponge stick.  The posterior lip was also grasped with this using a single suture of 1-0 Prolene.  A McDonald cerclage was placed with interrupted sutures tying them at 12.  Vaginal bleeding was within normal limits and easily cleaned with sponge stick.  The knot was tied again just to make the suture longer. The speculum was removed.  Her vagina was  cleaned with piece of gauze, and she was returned to supine position.  All sponge, lap, and needle counts were correct x2 per the operating room staff.     Sherron Monday, MD     JB/MEDQ  D:  04/03/2014  T:  04/03/2014  Job:  [id number]

## 2014-04-05 ENCOUNTER — Inpatient Hospital Stay (HOSPITAL_COMMUNITY)
Admission: AD | Admit: 2014-04-05 | Discharge: 2014-04-06 | Disposition: A | Discharge status: Home / Self Care | Payer: 59 | Source: Ambulatory Visit | Attending: Obstetrics and Gynecology | Admitting: Obstetrics and Gynecology

## 2014-04-05 DIAGNOSIS — O99891 Other specified diseases and conditions complicating pregnancy: Secondary | ICD-10-CM | POA: Insufficient documentation

## 2014-04-05 DIAGNOSIS — O3430 Maternal care for cervical incompetence, unspecified trimester: Secondary | ICD-10-CM | POA: Insufficient documentation

## 2014-04-05 DIAGNOSIS — O9989 Other specified diseases and conditions complicating pregnancy, childbirth and the puerperium: Principal | ICD-10-CM

## 2014-04-05 DIAGNOSIS — R109 Unspecified abdominal pain: Secondary | ICD-10-CM

## 2014-04-06 ENCOUNTER — Encounter (HOSPITAL_COMMUNITY): Payer: Self-pay

## 2014-04-06 DIAGNOSIS — O3430 Maternal care for cervical incompetence, unspecified trimester: Secondary | ICD-10-CM | POA: Diagnosis not present

## 2014-04-06 DIAGNOSIS — O99891 Other specified diseases and conditions complicating pregnancy: Secondary | ICD-10-CM | POA: Diagnosis not present

## 2014-04-06 DIAGNOSIS — R109 Unspecified abdominal pain: Secondary | ICD-10-CM | POA: Diagnosis present

## 2014-04-06 LAB — URINE MICROSCOPIC-ADD ON

## 2014-04-06 LAB — URINALYSIS, ROUTINE W REFLEX MICROSCOPIC
Bilirubin Urine: NEGATIVE
Glucose, UA: NEGATIVE mg/dL
Ketones, ur: NEGATIVE mg/dL
Nitrite: NEGATIVE
PROTEIN: NEGATIVE mg/dL
Specific Gravity, Urine: 1.025 (ref 1.005–1.030)
UROBILINOGEN UA: 0.2 mg/dL (ref 0.0–1.0)
pH: 6 (ref 5.0–8.0)

## 2014-04-06 NOTE — Discharge Instructions (Signed)

## 2014-04-06 NOTE — MAU Note (Signed)
About 2100 started feeling this "bubble" feeling and then feel a "pop" and then would feel pressure in lower abd for 3-14mins. Would stop couple mins and then start again. Had vag cerclage placed on WEdnesday.
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
## 2014-04-07 LAB — URINE CULTURE
Colony Count: 50000
SPECIAL REQUESTS: NORMAL

## 2014-04-11 ENCOUNTER — Other Ambulatory Visit (HOSPITAL_COMMUNITY): Payer: Self-pay | Admitting: Obstetrics and Gynecology

## 2014-04-11 DIAGNOSIS — O2622 Pregnancy care for patient with recurrent pregnancy loss, second trimester: Secondary | ICD-10-CM

## 2014-04-11 DIAGNOSIS — O3432 Maternal care for cervical incompetence, second trimester: Secondary | ICD-10-CM

## 2014-04-17 ENCOUNTER — Other Ambulatory Visit: Payer: Self-pay | Admitting: Obstetrics and Gynecology

## 2014-04-17 ENCOUNTER — Ambulatory Visit (HOSPITAL_COMMUNITY)
Admission: RE | Admit: 2014-04-17 | Discharge: 2014-04-17 | Disposition: A | Discharge status: Home / Self Care | Payer: 59 | Source: Ambulatory Visit | Attending: Obstetrics and Gynecology | Admitting: Obstetrics and Gynecology

## 2014-04-17 ENCOUNTER — Encounter (HOSPITAL_COMMUNITY): Payer: Self-pay

## 2014-04-17 VITALS — BP 112/65 | HR 79 | Wt 164.0 lb

## 2014-04-17 DIAGNOSIS — O43899 Other placental disorders, unspecified trimester: Secondary | ICD-10-CM

## 2014-04-17 DIAGNOSIS — O3430 Maternal care for cervical incompetence, unspecified trimester: Secondary | ICD-10-CM

## 2014-04-17 DIAGNOSIS — O3432 Maternal care for cervical incompetence, second trimester: Secondary | ICD-10-CM

## 2014-04-17 DIAGNOSIS — O2620 Pregnancy care for patient with recurrent pregnancy loss, unspecified trimester: Secondary | ICD-10-CM | POA: Insufficient documentation

## 2014-04-17 DIAGNOSIS — O2622 Pregnancy care for patient with recurrent pregnancy loss, second trimester: Secondary | ICD-10-CM
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
## 2014-04-18 ENCOUNTER — Encounter (HOSPITAL_COMMUNITY): Payer: Self-pay | Admitting: *Deleted

## 2014-04-18 ENCOUNTER — Inpatient Hospital Stay (HOSPITAL_COMMUNITY)
Admission: AD | Admit: 2014-04-18 | Discharge: 2014-06-21 | DRG: 774 | Disposition: A | Discharge status: Home / Self Care | Payer: 59 | Source: Ambulatory Visit | Attending: Obstetrics and Gynecology | Admitting: Obstetrics and Gynecology

## 2014-04-18 DIAGNOSIS — F439 Reaction to severe stress, unspecified: Secondary | ICD-10-CM | POA: Diagnosis present

## 2014-04-18 DIAGNOSIS — O26872 Cervical shortening, second trimester: Secondary | ICD-10-CM | POA: Diagnosis present

## 2014-04-18 DIAGNOSIS — J45909 Unspecified asthma, uncomplicated: Secondary | ICD-10-CM | POA: Diagnosis present

## 2014-04-18 DIAGNOSIS — F419 Anxiety disorder, unspecified: Secondary | ICD-10-CM | POA: Diagnosis present

## 2014-04-18 DIAGNOSIS — Z87820 Personal history of traumatic brain injury: Secondary | ICD-10-CM | POA: Diagnosis not present

## 2014-04-18 DIAGNOSIS — F329 Major depressive disorder, single episode, unspecified: Secondary | ICD-10-CM | POA: Diagnosis present

## 2014-04-18 DIAGNOSIS — O352XX0 Maternal care for (suspected) hereditary disease in fetus, not applicable or unspecified: Secondary | ICD-10-CM

## 2014-04-18 DIAGNOSIS — O021 Missed abortion: Secondary | ICD-10-CM

## 2014-04-18 DIAGNOSIS — Z3A25 25 weeks gestation of pregnancy: Secondary | ICD-10-CM

## 2014-04-18 DIAGNOSIS — K219 Gastro-esophageal reflux disease without esophagitis: Secondary | ICD-10-CM | POA: Diagnosis present

## 2014-04-18 DIAGNOSIS — Z993 Dependence on wheelchair: Secondary | ICD-10-CM | POA: Diagnosis not present

## 2014-04-18 DIAGNOSIS — M549 Dorsalgia, unspecified: Secondary | ICD-10-CM | POA: Diagnosis present

## 2014-04-18 DIAGNOSIS — O3432 Maternal care for cervical incompetence, second trimester: Secondary | ICD-10-CM | POA: Diagnosis present

## 2014-04-18 DIAGNOSIS — Z3A30 30 weeks gestation of pregnancy: Secondary | ICD-10-CM | POA: Insufficient documentation

## 2014-04-18 DIAGNOSIS — Z3A27 27 weeks gestation of pregnancy: Secondary | ICD-10-CM

## 2014-04-18 DIAGNOSIS — O4403 Placenta previa specified as without hemorrhage, third trimester: Secondary | ICD-10-CM | POA: Diagnosis present

## 2014-04-18 DIAGNOSIS — Z3A23 23 weeks gestation of pregnancy: Secondary | ICD-10-CM | POA: Diagnosis present

## 2014-04-18 DIAGNOSIS — O24419 Gestational diabetes mellitus in pregnancy, unspecified control: Secondary | ICD-10-CM | POA: Diagnosis present

## 2014-04-18 DIAGNOSIS — Z8751 Personal history of pre-term labor: Secondary | ICD-10-CM

## 2014-04-18 DIAGNOSIS — Z23 Encounter for immunization: Secondary | ICD-10-CM

## 2014-04-18 DIAGNOSIS — O3433 Maternal care for cervical incompetence, third trimester: Secondary | ICD-10-CM | POA: Insufficient documentation

## 2014-04-18 DIAGNOSIS — K5900 Constipation, unspecified: Secondary | ICD-10-CM | POA: Diagnosis present

## 2014-04-18 DIAGNOSIS — O4420 Partial placenta previa NOS or without hemorrhage, unspecified trimester: Secondary | ICD-10-CM | POA: Clinically undetermined

## 2014-04-18 HISTORY — DX: Partial placenta previa nos or without hemorrhage, unspecified trimester: O44.20

## 2014-04-18 LAB — CBC
HCT: 32.1 % — ABNORMAL LOW (ref 36.0–46.0)
Hemoglobin: 10.8 g/dL — ABNORMAL LOW (ref 12.0–15.0)
MCH: 31.2 pg (ref 26.0–34.0)
MCHC: 33.6 g/dL (ref 30.0–36.0)
MCV: 92.8 fL (ref 78.0–100.0)
PLATELETS: 300 10*3/uL (ref 150–400)
RBC: 3.46 MIL/uL — AB (ref 3.87–5.11)
RDW: 13.7 % (ref 11.5–15.5)
WBC: 12.7 10*3/uL — ABNORMAL HIGH (ref 4.0–10.5)

## 2014-04-18 LAB — TYPE AND SCREEN
ABO/RH(D): A POS
Antibody Screen: NEGATIVE

## 2014-04-18 MED ORDER — PROGESTERONE MICRONIZED 200 MG PO CAPS
200.0000 mg | ORAL_CAPSULE | Freq: Every day | ORAL | Status: DC
  Administered 2014-04-18 – 2014-06-20 (×63): 200 mg via VAGINAL
  Filled 2014-04-18 (×64): qty 1

## 2014-04-18 MED ORDER — DOCUSATE SODIUM 100 MG PO CAPS
100.0000 mg | ORAL_CAPSULE | Freq: Every day | ORAL | Status: DC
  Administered 2014-04-19 – 2014-06-21 (×64): 100 mg via ORAL
  Filled 2014-04-18 (×63): qty 1

## 2014-04-18 MED ORDER — PRENATAL MULTIVITAMIN CH
1.0000 | ORAL_TABLET | Freq: Every day | ORAL | Status: DC
Start: 2014-04-18 — End: 2014-06-21
  Administered 2014-04-18 – 2014-06-21 (×65): 1 via ORAL
  Filled 2014-04-18 (×65): qty 1

## 2014-04-18 MED ORDER — ACETAMINOPHEN 325 MG PO TABS
650.0000 mg | ORAL_TABLET | ORAL | Status: DC | PRN
  Administered 2014-04-20 – 2014-06-20 (×85): 650 mg via ORAL
  Filled 2014-04-18 (×87): qty 2

## 2014-04-18 MED ORDER — ZOLPIDEM TARTRATE 5 MG PO TABS
5.0000 mg | ORAL_TABLET | Freq: Every evening | ORAL | Status: DC | PRN
  Administered 2014-04-22 – 2014-06-20 (×53): 5 mg via ORAL
  Filled 2014-04-18 (×59): qty 1

## 2014-04-18 MED ORDER — ALPRAZOLAM 0.5 MG PO TABS
0.5000 mg | ORAL_TABLET | Freq: Once | ORAL | Status: AC
  Administered 2014-04-18: 0.5 mg via ORAL
  Filled 2014-04-18: qty 1

## 2014-04-18 MED ORDER — CALCIUM CARBONATE ANTACID 500 MG PO CHEW
2.0000 | CHEWABLE_TABLET | ORAL | Status: DC | PRN
  Administered 2014-05-22 – 2014-05-26 (×3): 400 mg via ORAL
  Filled 2014-04-18 (×4): qty 1

## 2014-04-18 MED ORDER — SODIUM CHLORIDE 0.9 % IV SOLN: 250.0000 mL | INTRAVENOUS | Status: DC | PRN

## 2014-04-18 MED ORDER — SODIUM CHLORIDE 0.9 % IJ SOLN: 3.0000 mL | INTRAMUSCULAR | Status: DC | PRN

## 2014-04-18 MED ORDER — BETAMETHASONE SOD PHOS & ACET 6 (3-3) MG/ML IJ SUSP
12.0000 mg | INTRAMUSCULAR | Status: AC
  Administered 2014-04-18 – 2014-04-19 (×2): 12 mg via INTRAMUSCULAR
  Filled 2014-04-18 (×2): qty 2

## 2014-04-18 MED ORDER — SODIUM CHLORIDE 0.9 % IJ SOLN
3.0000 mL | Freq: Two times a day (BID) | INTRAMUSCULAR | Status: DC
  Administered 2014-04-18 – 2014-04-23 (×9): 3 mL via INTRAVENOUS

## 2014-04-18 NOTE — Progress Notes (Signed)
Neo Consult called to Dr. Francine Gravenimaguila. If not seen today, please call them in the morning. Thanks!

## 2014-04-18 NOTE — Progress Notes (Signed)
Patient ID: [name], female   DOB: February 02, 1986, 28 y.o.   MRN: [id number]  D/W pt and FOB vasa previa as diagnosis.  Also possible POC including admission for BMZ, readmission at 30+ week for early delivery.  Will have MFM reassess vasa previa tomorrow.    [place] [facility] [place] [place].  "Lungs need pumped."

## 2014-04-19 ENCOUNTER — Encounter (HOSPITAL_COMMUNITY): Payer: Self-pay | Admitting: Anesthesiology

## 2014-04-19 ENCOUNTER — Ambulatory Visit (HOSPITAL_COMMUNITY): Payer: 59

## 2014-04-19 ENCOUNTER — Inpatient Hospital Stay (HOSPITAL_COMMUNITY): Payer: 59

## 2014-04-19 ENCOUNTER — Encounter (HOSPITAL_COMMUNITY): Payer: Self-pay | Admitting: *Deleted

## 2014-04-19 MED ORDER — HYDROXYZINE HCL 25 MG PO TABS
25.0000 mg | ORAL_TABLET | Freq: Four times a day (QID) | ORAL | Status: DC | PRN
  Administered 2014-04-19 – 2014-05-01 (×13): 25 mg via ORAL
  Filled 2014-04-19 (×12): qty 1

## 2014-04-19 NOTE — Progress Notes (Signed)
Attempting to do pt FHR monitoring. Pt eating will call when ready for monitoring.

## 2014-04-19 NOTE — Progress Notes (Signed)
Dr. Mikle Boswortharlos, neonatologist, into see pt.

## 2014-04-19 NOTE — Progress Notes (Signed)
Patient ID: [name], female   DOB: [birth year], 28 y.o.   MRN: [id number]  23+3 with vasa previa/short cervix/cerclage  Pt with MFM consult today.  D/W pt seriousness of situation, For now patient and FOB desire to remain in house.  Pt with some anxiety, will order prn Atarax  Spoke with pat and FOB re plan.  For now will remain in house.  Will monitor Vasa previa by US.  S/P BMZ
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
## 2014-04-19 NOTE — Progress Notes (Signed)
Patient ID: [name], female   DOB: 1986/03/26, 28 y.o.   MRN: [id number]  23+3 with short cervic/cerclage and vasa previa  No c/o's.  +FM, no LOF, no VB, no ctx.    AFVSS gen NAD FHTs 140's, appropriate for gestation. toco none  MFM appt today
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
## 2014-04-20 LAB — CULTURE, BETA STREP (GROUP B ONLY)

## 2014-04-20 LAB — OB RESULTS CONSOLE GBS: STREP GROUP B AG: NEGATIVE

## 2014-04-20 MED ORDER — MAGNESIUM HYDROXIDE 400 MG/5ML PO SUSP
30.0000 mL | Freq: Every evening | ORAL | Status: DC | PRN
  Administered 2014-04-20: 30 mL via ORAL
  Filled 2014-04-20: qty 30

## 2014-04-20 NOTE — Progress Notes (Signed)
Pt off the monitor after reassurring FHR  

## 2014-04-20 NOTE — Progress Notes (Signed)
Pt sitting up in the bed eating breakfast  

## 2014-04-20 NOTE — Progress Notes (Signed)
Pt requesting laxative, states no BM in 2 days.  Informed MD to be notified for order.  Pt verbalized understanding & agreeable.

## 2014-04-20 NOTE — Progress Notes (Signed)
[redacted]W[redacted]D, vasa previa No problems, anxiety ok for now Afeb, VSS, +FHT Has adequate IV access for now Continue observation, will discuss PICC line on Monday

## 2014-04-21 LAB — TYPE AND SCREEN
ABO/RH(D): A POS
Antibody Screen: NEGATIVE

## 2014-04-21 NOTE — Progress Notes (Signed)
[redacted]W[redacted]D, vasa previa No problems, nothing new, +FM, no bleeding Continue observation

## 2014-04-21 NOTE — Plan of Care (Signed)
Problem: Phase I Progression Outcomes Goal: Bowel movement every 2 days Outcome: Progressing MOM given last night and discussed with pt some menu options that may assist with BM

## 2014-04-21 NOTE — Progress Notes (Signed)
Lab tech into renew T&S.

## 2014-04-22 MED ORDER — ALBUTEROL SULFATE HFA 108 (90 BASE) MCG/ACT IN AERS: 2.0000 | INHALATION_SPRAY | RESPIRATORY_TRACT | Status: DC | PRN

## 2014-04-22 MED ORDER — POLYETHYLENE GLYCOL 3350 17 G PO PACK
17.0000 g | PACK | Freq: Every day | ORAL | Status: DC
  Administered 2014-04-22 – 2014-06-05 (×43): 17 g via ORAL
  Filled 2014-04-22 (×53): qty 1

## 2014-04-22 MED ORDER — CYCLOBENZAPRINE HCL 10 MG PO TABS
10.0000 mg | ORAL_TABLET | Freq: Once | ORAL | Status: AC
  Administered 2014-04-22: 10 mg via ORAL
  Filled 2014-04-22: qty 1

## 2014-04-22 MED ORDER — ALBUTEROL SULFATE (2.5 MG/3ML) 0.083% IN NEBU
2.5000 mg | INHALATION_SOLUTION | RESPIRATORY_TRACT | Status: DC | PRN
  Administered 2014-04-23 – 2014-04-26 (×2): 2.5 mg via RESPIRATORY_TRACT
  Filled 2014-04-22 (×3): qty 3

## 2014-04-22 MED ORDER — CYCLOBENZAPRINE HCL 10 MG PO TABS
10.0000 mg | ORAL_TABLET | Freq: Three times a day (TID) | ORAL | Status: DC | PRN
  Administered 2014-04-23 – 2014-06-20 (×83): 10 mg via ORAL
  Filled 2014-04-22 (×86): qty 1

## 2014-04-22 NOTE — Progress Notes (Signed)
Patient ID: Mariah BumpsJessica Vandekamp, female   DOB: Feb 29, 1986, 28 y.o.   MRN: [id number]  In light of patient's need for continuous IV access, plan for PICC line vs midline IV made with IV team.  Pt unable to be anti-coagulated.  Discussion with IV team, PICC line able to be maintained for 1 year. Thought to be less clot risk as ends in larger wein. Midline only for 28-30 days.  Feels more likely to clot off and cause clots.    Will d/w Mariah BumpsJessica in AM, plan for likely PICC placement,

## 2014-04-22 NOTE — Progress Notes (Signed)
Ur chart review completed.  

## 2014-04-22 NOTE — Progress Notes (Signed)
Patient ID: [name], female   DOB: July 01, 1986, 28 y.o.   MRN: [id number]  23+6, cervical incompetence/cerclage and vasa previa  +FM, no LOF, no VB, no ctx.  Overnight had small reddish/brownish discharge - will continue to monitor - constipated and cerclage.  Still with issues with son - will write for SW consult, d/w pt.  Has h/o asthma - write for albuterol.  Will d/w MFM, etc re PICC line.  D/w pt r/b/a...  With back pain - will try flexeril and PT for exercices.  Will write for daily wheelchair rides (close to unit), NICU tour  AFVSS gen NAD Abd soft, gravid, NT FHTs 145-150, mod var, appropriate for gestation toco none  Continue current mgmt SW consult Albuterol PT  Miralax Wheelchair rides

## 2014-04-23 ENCOUNTER — Inpatient Hospital Stay (HOSPITAL_COMMUNITY): Payer: 59

## 2014-04-23 MED ORDER — SODIUM CHLORIDE 0.9 % IJ SOLN
10.0000 mL | Freq: Two times a day (BID) | INTRAMUSCULAR | Status: DC
  Administered 2014-04-23 – 2014-06-21 (×111): 10 mL
  Filled 2014-04-23: qty 10
  Filled 2014-04-23: qty 40
  Filled 2014-04-23 (×3): qty 10
  Filled 2014-04-23 (×3): qty 40
  Filled 2014-04-23 (×2): qty 10
  Filled 2014-04-23 (×2): qty 40
  Filled 2014-04-23 (×2): qty 10
  Filled 2014-04-23: qty 40
  Filled 2014-04-23: qty 10
  Filled 2014-04-23 (×3): qty 40
  Filled 2014-04-23: qty 10
  Filled 2014-04-23: qty 40

## 2014-04-23 MED ORDER — SODIUM CHLORIDE 0.9 % IJ SOLN
10.0000 mL | INTRAMUSCULAR | Status: DC | PRN
  Administered 2014-04-25 – 2014-05-21 (×4): 10 mL
  Administered 2014-05-27: 20 mL
  Administered 2014-05-30 – 2014-06-18 (×3): 10 mL
  Filled 2014-04-23 (×29): qty 40
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
## 2014-04-23 NOTE — Progress Notes (Signed)
CSW attempted to meet with patient to complete assessment, but team was coming to place PICC at this time.  CSW spoke with patient's RN/Debbie and requests to be called when patient is settled after procedure.  RN agreed.

## 2014-04-23 NOTE — Progress Notes (Signed)
Patient ID: [name], female   DOB: [birth year], 28 y.o.   MRN: [id number]  24wk with incompetent cervix/cerclage and vasa previa  No c/o's.  +FM, no LOF, no VB, no cramping.  Some mid/upper back pain - using heat, flexeril.  Working for IV access.    AFVSS gen NAD FHTs 150's good variability, category 1 toco none  Continue current management PICC placement today D/w pt r/b/a of cesarean section including but not limited to bleeding, infection, damage to surrounding organs, injury to infant and trouble healing.

## 2014-04-24 LAB — TYPE AND SCREEN
ABO/RH(D): A POS
Antibody Screen: NEGATIVE

## 2014-04-24 NOTE — Progress Notes (Addendum)
Patient ID: Mariah BumpsJessica Selman, female   DOB: [birth year], 28 y.o.   MRN: [id number]  24+1 with incompetent cervic/[employer] cerclage and Vasa Previa  No c/o's, +FM, no LOF, no VB, no ctx.  Upper back pain improved.  PICC placed.  PT saw pt yesterday.    AFVSS gen NAD Abd gravid, NT  FHTs 150's, good variability, 10x10 accels toco none  Continue current management

## 2014-04-25 NOTE — Progress Notes (Signed)
CSW left message for Clinical Social Work Department at [facility].

## 2014-04-25 NOTE — Progress Notes (Signed)
CSW spoke to Scientist, water qualityarah Houston/social worker at [facility] to explain patient's situation/custody agreement.  CSW faxed custody order and gave SW patient's name and phone number.  CSW spoke with patient to inform.

## 2014-04-25 NOTE — Progress Notes (Signed)
Antenatal Nutrition Assessment:  Currently  24 2/[redacted] weeks gestation, with vasa previa. Height  63 "  Weight 164 lbs  pre-pregnancy weight 159 lbs .  Pre-pregnancy  BMI 28.2  IBW 115 lbs Total weight gain 5.lbs Weight gain goals 15-25 lbs Estimated needs: 1800-2000 kcal/Mantia, 63-73 grams protein/Ayon, 2/1 liters fluid/Green  Regular diet tolerated well, appetite good. Provided snack and retail menus Current diet prescription will provide for increased needs.  No abnormal nutrition related labs  Nutrition Dx: Increased nutrient needs r/t pregnancy and fetal growth requirements aeb [redacted] weeks gestation.  No educational needs assessed at this time.  Elisabeth CaraKatherine Ember Henrikson M.Odis LusterEd. R.D. LDN Neonatal Nutrition Support Specialist/RD III Pager [phone]

## 2014-04-25 NOTE — Progress Notes (Signed)
CSW received call from patient stating she has spoken with her son's hospital social worker and now has the code to access the medical staff for updates.  She states she is relieved.  She thanked CSW.  She asked CSW for assistance in talking with her about questions to ask before she calls.  CSW brainstormed with her.  She was very Adult nurseappreciative.

## 2014-04-25 NOTE — Progress Notes (Signed)
Patient ID: [name], female   DOB: 23-Jan-1986, 28 y.o.   MRN: [id number]  28yo [id number] at 24+ with Vasa Previa/ Incompetent cervix/cerclage placed  No c/o's.  +FM, no LOF, no VB, no ctx  AFVSS gen NAD FHTs 150's, Reactive for gestational age, category 1 toco none  Continue current mgmt Continuous type and screen Emergency release blood to OR for delivery SW help

## 2014-04-26 NOTE — Progress Notes (Signed)
Patient ID: [name], female   DOB: 1986-04-27, 28 y.o.   MRN: [id number]  24+3 with incompetent cervix/cerclage and vasa previa  No c/o's, +FM, no LOF, no VB, no ctx; back pain, relieved  AFVSS gen NAD FHTs 150's, good variability, category 1 toco none  Continue current mgmt

## 2014-04-26 NOTE — Progress Notes (Signed)
Called to patients room for breathing treatment/for anxiety. Patient is clear throughout. No wheezing noted. Let patient know she is clear and no wheezing heard. She said she has taken them long enough to know she needs one. No change before or after treatment. Explained will keep patient up/dh

## 2014-04-27 LAB — TYPE AND SCREEN
ABO/RH(D): A POS
ANTIBODY SCREEN: NEGATIVE

## 2014-04-27 NOTE — Progress Notes (Signed)
Patient ID: Mariah BumpsJessica Slaugh, female   DOB: 12-18-1985, 28 y.o.   MRN: [id number] 24 4/7 weeks vasa previa and incompetent cervix Denies cramping afeb vss FhR reassuring with occasional mild variables Baby very active  S/p betamethasone 10/1 and 10/2 Continue current care

## 2014-04-28 NOTE — Progress Notes (Signed)
Patient ID: [name], female   DOB: 08-12-1985, 28 y.o.   MRN: [id number] 24 5/7 weeks vasa previa/incompetent cervix Pt is feeling stressed about her son in [place], but no other issues except some back pain afeb vss Fundus NT S/p Betamethasone 10/1 and 10/2 and has PICC line

## 2014-04-29 NOTE — Progress Notes (Signed)
Patient ID: [name], female   DOB: 04-08-1986, 28 y.o.   MRN: [id number]  24+6 with vasa previa/incompetent cervix/ cerclage  +FM, no LOF, no VB, no ctx; no c/o's.  Some concerns with son in [place], but OK AFVSS gen NAD Abd soft, gravid, NT  FHTs 150's, good variabilit, category 1 toco none  Continue current management Will try to respect sleep - write order

## 2014-04-30 LAB — TYPE AND SCREEN
ABO/RH(D): A POS
ANTIBODY SCREEN: NEGATIVE

## 2014-04-30 MED ORDER — SERTRALINE HCL 25 MG PO TABS
25.0000 mg | ORAL_TABLET | Freq: Every day | ORAL | Status: AC
  Administered 2014-04-30 – 2014-05-06 (×7): 25 mg via ORAL
  Filled 2014-04-30 (×7): qty 1

## 2014-04-30 MED ORDER — SERTRALINE HCL 50 MG PO TABS
50.0000 mg | ORAL_TABLET | Freq: Every day | ORAL | Status: DC
  Administered 2014-05-07 – 2014-06-21 (×46): 50 mg via ORAL
  Filled 2014-04-30 (×47): qty 1

## 2014-04-30 NOTE — Plan of Care (Signed)
Problem: Phase I Progression Outcomes Goal: Home/Self Care (Notify RN, CM, or SW/CM) Outcome: Progressing CSW following patient for social needs regarding [place]

## 2014-04-30 NOTE — Progress Notes (Signed)
Patient ID: [name], female   DOB: 1986-01-28, 28 y.o.   MRN: [id number]  25wk vasa previa/incompetent cervix/cerclage  +FM, no LOF, no VB, no ctx; no c/o's  AFVSS gen NAD Abd soft, gravid, NT  FHTs 145-150, good var, category 1 toco none  Continue current management MFM scan tomorrow to reassess placenta/vasa previa

## 2014-04-30 NOTE — Plan of Care (Signed)
Problem: Phase I Progression Outcomes Goal: Contractions < 5-6/hour Outcome: Progressing Pt. Has not had contractions

## 2014-04-30 NOTE — Plan of Care (Signed)
Problem: Phase I Progression Outcomes Goal: LOS < 4 days Outcome: Not Applicable Date Met:  04/30/14 Pt. Has been inpatient for 12 days r/t vasa previa diagnosis      

## 2014-04-30 NOTE — Progress Notes (Signed)
Patient ID: Mariah BumpsJessica Shearn, female   DOB: 1986/02/19, 28 y.o.   MRN: [id number]  Pt with stress/anxiety.  Has h/o depression and anxiety, has taken Lexapro and Zoloft in past.  D/W pt SSRI as class C meds and some adjustment to difficult dx (vasa previa) and uncertainty,  Will try Zoloft. D/W pt expectations.

## 2014-04-30 NOTE — Plan of Care (Signed)
Problem: Consults Goal: Designer, fashion/clothing (Diversional Activities, Age Appropriate) Outcome: Completed/Met Date Met:  04/30/14 Pt. Has puzzles, coloring books, and electronic resources at bedside for extended stay

## 2014-05-01 ENCOUNTER — Inpatient Hospital Stay (HOSPITAL_COMMUNITY): Payer: 59

## 2014-05-01 MED ORDER — CYCLOBENZAPRINE HCL 10 MG PO TABS: 10.0000 mg | ORAL_TABLET | ORAL | Status: DC

## 2014-05-01 NOTE — Progress Notes (Signed)
Patient ID: Shanda BumpsJessica Loyal, female   DOB: Oct 05, 1985, 28 y.o.   MRN: [id number]  25+ with vasa previa/incompetent cervix/ cerclage. S/p BMZ  No C/o's +FM, no LOF, no VB, no ctx  AFVSS gen NAD Abd soft, gravid, NT  FHTs 150's, good var, category 1 toco none  US today Continue current mgmt

## 2014-05-02 NOTE — Plan of Care (Signed)
Problem: Phase I Progression Outcomes Goal: No significant worsening bleeding/cervix change/vag drainage No significant worsening in vaginal bleeding, cervical change, or vaginal drainage.  Outcome: Progressing Pt. Continues to deny vag. Bleed/drainage at this time.

## 2014-05-02 NOTE — Progress Notes (Signed)
Patient ID: [name], female   DOB: 04/22/1986, 28 y.o.   MRN: [id number]  25+3 with vasa previa, incompetent cervix, cerclage  +FM, no LOF, no VB, no ctx.  No c/o's.  Anxiety improving  AFVSS gen NAD Abd soft, gravid, NT  FHTs 150's good var, category 1 toco none  US - technically ?vasa previa, but placental lake and succinteric lobe.  Pt to repeat BMZ at 28wk.  Plan for delivery 30-34 wk D/w MFM Continue current mgmt

## 2014-05-03 LAB — TYPE AND SCREEN
ABO/RH(D): A POS
Antibody Screen: NEGATIVE

## 2014-05-03 NOTE — Progress Notes (Signed)
Patient ID: Shanda BumpsJessica Meas, female   DOB: 03-Sep-1985, 28 y.o.   MRN: [id number]  25+3 vasa previa, incompetent cervix and cerclage, s/p BMZ will repeat at 28 wk  No c/o's.  +FM, no LOF, no VB, no ctx  AFVSS gen NAD Abd soft, gravid, NT  FHTs 150's good var, category 1 toco none  Continue current mgmt

## 2014-05-04 NOTE — Progress Notes (Signed)
Patient ID: [name], female   DOB: 03-06-1986, 28 y.o.   MRN: [id number] Pt condition unchanged. Temp normal FHR tracings are normal without decelerations Continue present observation

## 2014-05-04 NOTE — Plan of Care (Signed)
Problem: Consults Goal: Birthing Suites Patient Information Press F2 to bring up selections list  Outcome: Not Applicable Date Met:  15/40/08  Patient is admitted to antenatal department for prolonged hospitalization.   Problem: Phase I Progression Outcomes Goal: Spiritual Needs (Notify SW/CM or Social Work) Outcome: Progressing Patient is aware of spiritual care available here at [facility] hospital. Informed patient of letting nurse know if she has a need. Patient receptive.

## 2014-05-04 NOTE — Plan of Care (Signed)
Problem: Phase II Progression Outcomes Goal: Medications as ordered (see description) Medications as ordered (Magnesium Sulfate, Steroids, PO Tocolysis, Antibiotics, Terbutaline Pump, Other)  Outcome: Progressing Informed patient of antenatal medications that are prescribed. Patient receptive.

## 2014-05-05 NOTE — Progress Notes (Signed)
Patient ID: [name], female   DOB: 1986/07/19, 28 y.o.   MRN: [id number] Pt is afebrile with no complaints

## 2014-05-06 LAB — TYPE AND SCREEN
ABO/RH(D): A POS
Antibody Screen: NEGATIVE

## 2014-05-06 NOTE — Progress Notes (Signed)
Patient ID: Shanda BumpsJessica Mattos, female   DOB: 1986/03/27, 28 y.o.   MRN: [id number]  25+6 with vasa previa/incompetent cervix and cerclage.  S/p BMZ  No C/o's.  +FM, no LOF, no VB, no ctx.  Anxiety better with Zoloft.    AFVSS gen NAD Abd soft, gravid, NT  FHTs 150's, good var, category 1 toco none  Continue current mgmt

## 2014-05-06 NOTE — Progress Notes (Signed)
Ur chart review completed.  

## 2014-05-07 NOTE — Progress Notes (Signed)
PT note Checked in on patient.  She was ambulating back from bathroom with no obvious issues.  Patient does report she is still experiencing some back pain.  Back pain appears to be more in thoracic region and appears to be mostly musculoskeletal, most likely from limited activity/bedrest.  Encouraged patient to continue to perform exercises and to consider trying a tennis ball in a sock to roll over her back to help relax muscles.   Will continue to monitor patient for needs.  05/07/2014 [name], PT [phone]

## 2014-05-07 NOTE — Progress Notes (Signed)
Patient ID: Shanda BumpsJessica Rohlman, female   DOB: [birth year], 28 y.o.   MRN: [id number]  26wk vasa previa/incompetent cervix/cerclage  No c/o's.  +FM, no LOF, no VB, no ctx  AFVSS gen NAD Abd soft, gravid, NT FHTs 150's, good var, category 1 toco none  Continue current care. Repeat BMZ at 28 wk

## 2014-05-08 NOTE — Progress Notes (Signed)
Patient ID: Shanda BumpsJessica Ridgley, female   DOB: [birth year], 28 y.o.   MRN: [id number]  26+1 - vasa previa/incompetent cervix/cerclage  Doing well.  No c/o's.  +FM, no LOF, no VB, no ctx  AFVSS gen NAD Abd soft, gravid, NT FHTs 140-150's, good var, category 1 toco none  Continue current mgmt

## 2014-05-08 NOTE — Plan of Care (Signed)
Problem: Phase I Progression Outcomes Goal: Maintains reassuring Fetal Heart Rate Outcome: Progressing NST Q shift with reactive FHR pattern.

## 2014-05-09 LAB — TYPE AND SCREEN
ABO/RH(D): A POS
ANTIBODY SCREEN: NEGATIVE

## 2014-05-09 MED ORDER — PSEUDOEPHEDRINE HCL 30 MG PO TABS
30.0000 mg | ORAL_TABLET | Freq: Four times a day (QID) | ORAL | Status: DC | PRN
  Administered 2014-05-09 – 2014-05-14 (×6): 30 mg via ORAL
  Filled 2014-05-09 (×6): qty 1

## 2014-05-09 MED ORDER — PSEUDOEPHEDRINE HCL 30 MG/5ML PO SYRP
30.0000 mg | ORAL_SOLUTION | Freq: Four times a day (QID) | ORAL | Status: DC | PRN
  Filled 2014-05-09: qty 5

## 2014-05-09 MED ORDER — MENTHOL 3 MG MT LOZG
1.0000 | LOZENGE | OROMUCOSAL | Status: DC | PRN
  Administered 2014-05-09 – 2014-05-10 (×2): 3 mg via ORAL
  Filled 2014-05-09: qty 9

## 2014-05-09 MED ORDER — PHENOL 1.4 % MT LIQD
1.0000 | OROMUCOSAL | Status: DC | PRN
  Administered 2014-05-09 – 2014-05-11 (×2): 1 via OROMUCOSAL
  Filled 2014-05-09: qty 177

## 2014-05-09 MED ORDER — LORATADINE 10 MG PO TABS
10.0000 mg | ORAL_TABLET | Freq: Every day | ORAL | Status: DC
  Administered 2014-05-09 – 2014-06-21 (×44): 10 mg via ORAL
  Filled 2014-05-09 (×45): qty 1

## 2014-05-09 MED ORDER — OXYMETAZOLINE HCL 0.05 % NA SOLN
1.0000 | Freq: Two times a day (BID) | NASAL | Status: DC
  Administered 2014-05-09 – 2014-06-01 (×18): 1 via NASAL
  Filled 2014-05-09: qty 15

## 2014-05-09 MED ORDER — GUAIFENESIN 100 MG/5ML PO SOLN
10.0000 mL | Freq: Four times a day (QID) | ORAL | Status: DC | PRN
  Administered 2014-05-09: 200 mg via ORAL
  Administered 2014-05-10: 10 mL via ORAL
  Administered 2014-05-10 – 2014-05-14 (×5): 200 mg via ORAL
  Filled 2014-05-09 (×7): qty 15

## 2014-05-09 NOTE — Progress Notes (Signed)
Patient ID: [name], female   DOB: 1985-10-21, 28 y.o.   MRN: [id number]  26+2 with vasa previa/incompetent cervix/cerclage  No c/o's.  +FM, no LOF, no VB, no ctx  AFVSS gen NAD Abd soft, NT, gravid  FHTs 140-150's, good var, category 1 toco none   Continue current mgmt

## 2014-05-09 NOTE — Progress Notes (Signed)
Patient states she has a sore throat and sinus pressure and headache. She states she has seasonal allergies and usually takes Zyrtec. She is requesting allergy medicine, something to soothe sore throat and nasal decongestant spray.

## 2014-05-10 NOTE — Progress Notes (Signed)
Patient ID: [name], female   DOB: [birth year], 28 y.o.   MRN: [id number]  26+3 vasa previa/incompetent cervix/cerclage  No c/o's, +FM, no LOF, no VB, no ctx  AFVSS gen NAD Abd soft, gravid, NT  FHTs 140-150's, good var, category 1 toco none  Continue current mgmt

## 2014-05-11 NOTE — Progress Notes (Signed)
Patient ID: [name], female   DOB: 06/27/1986, 28 y.o.   MRN: [id number]  26+4 with vasa previa/incompetent cervic/cerclage  No c/o's.  +FM, no LOF, no VB, no ctx  AFVSS FHTs 145-150's, good var, category 1  toco none gen NAD Abd soft, gravid, NT  Continue current mgmt

## 2014-05-12 LAB — TYPE AND SCREEN
ABO/RH(D): A POS
Antibody Screen: NEGATIVE

## 2014-05-12 NOTE — Progress Notes (Signed)
Patient ID: [name], female   DOB: [birth year], 28 y.o.   MRN: [id number]  26+5, vasa previa/incompetent cervix/cerclage  No c/o's.  Still with URI, +FM, no LOF, no VB, no ctx  AFVSS gen NAD Abd soft, gravid, NT FHTs 140-150, good variability, category 1 toco none  Continue current mgmt

## 2014-05-13 NOTE — Progress Notes (Signed)
Patient ID: [name], female   DOB: 1985/12/05, 28 y.o.   MRN: [id number]  26+6, vasa previa, incompetent cervix, cerclage, s/p BMZ  No c/o's except URI sx's.  +FM, no LOF, no VB, no ctx  AFVSS gen NAD Abd soft, gravid NT  FHTs 145-150's good var, category 1 toco none  Continue current mgmt

## 2014-05-13 NOTE — Plan of Care (Signed)
Problem: Phase I Progression Outcomes Goal: Bowel movement every 2 days Outcome: Progressing Patient on miralax everyday.   Problem: Phase II Progression Outcomes Goal: Medications as ordered (see description) Medications as ordered (Magnesium Sulfate, Steroids, PO Tocolysis, Antibiotics, Terbutaline Pump, Other)  Outcome: Progressing Informed patient of antenatal medications frequently used. Patient receptive.

## 2014-05-13 NOTE — Progress Notes (Signed)
Ur chart review completed.  

## 2014-05-14 NOTE — Progress Notes (Signed)
Patient ID: [name], female   DOB: [birth year], 28 y.o.   MRN: [id number]  27wk, vasa previa, incompetent cervix, cerclage, s/p BMZ  No c/o's.  +FM, no LOF, no VB, no ctx  AFVSS gen NAD abd soft, gravid, NT FHs 135-150 good variability, category 1 toco occ  Continue current mgmt

## 2014-05-15 LAB — TYPE AND SCREEN
ABO/RH(D): A POS
ANTIBODY SCREEN: NEGATIVE

## 2014-05-15 NOTE — Plan of Care (Signed)
Problem: Phase I Progression Outcomes Goal: Bowel movement every 2 days Outcome: Progressing Patient taking Colace and Miralax daily.     Problem: Phase II Progression Outcomes Goal: Medications as ordered (see description) Medications as ordered (Magnesium Sulfate, Steroids, PO Tocolysis, Antibiotics, Terbutaline Pump, Other)  Outcome: Progressing Explained medication regimen for antenatal patients. Patient receptive.

## 2014-05-15 NOTE — Progress Notes (Signed)
Patient ID: [name], female   DOB: 24-Nov-1985, 28 y.o.   MRN: [id number]  27+1 vasa previa/incompetent cervix/cerclage  No c/o's.  +FM, no LOF, no VB, no ctx  gen nad abd soft, gravid, nt   fht 150's good var, category 1 toco none  Continue current mgmt

## 2014-05-16 MED ORDER — IBUPROFEN 600 MG PO TABS
600.0000 mg | ORAL_TABLET | Freq: Once | ORAL | Status: AC
  Administered 2014-05-16: 600 mg via ORAL
  Filled 2014-05-16: qty 1

## 2014-05-16 NOTE — Progress Notes (Signed)
Patient ID: Shanda BumpsJessica Mines, female   DOB: Aug 25, 1985, 28 y.o.   MRN: [id number]  27+2 vasa previa/incompetent cervix/cerclage. S/p BMZ  No c/o's.  +FM, no LOF,no VB, no ctx  AFVSS gen NAD Abd soft, gravid, NT FHTs 140-150, good variability, category 1 toco none  contniue current management Repeat BMZ at 28 wk US to follow vasa previa/growth Friday

## 2014-05-16 NOTE — Plan of Care (Signed)
Problem: Phase II Progression Outcomes Goal: Medications as ordered (see description) Medications as ordered (Magnesium Sulfate, Steroids, PO Tocolysis, Antibiotics, Terbutaline Pump, Other)  Outcome: Progressing Medications for antenatal patients discussed with patient. Patient receptive.

## 2014-05-17 ENCOUNTER — Ambulatory Visit (HOSPITAL_COMMUNITY): Payer: 59

## 2014-05-17 MED ORDER — IBUPROFEN 600 MG PO TABS
600.0000 mg | ORAL_TABLET | Freq: Once | ORAL | Status: AC
  Administered 2014-05-17: 600 mg via ORAL
  Filled 2014-05-17: qty 1

## 2014-05-17 MED ORDER — PROMETHAZINE HCL 25 MG PO TABS
12.5000 mg | ORAL_TABLET | Freq: Four times a day (QID) | ORAL | Status: DC | PRN
  Administered 2014-05-17 – 2014-06-18 (×5): 12.5 mg via ORAL
  Filled 2014-05-17 (×5): qty 1

## 2014-05-17 NOTE — Progress Notes (Signed)
Patient ID: [name], female   DOB: 03-18-1986, 28 y.o.   MRN: [id number]  27+3 vasa previa/incompetent cervix/cerclage, s/p BMZ, has PICC line  No c/o's.  +FM, no LOF, no VB, no ctx  AFVSS  gen NAD Abd soft, gravid, NT  FHTs 150's, good var, category 1 toco none  US in MFM this AM Continue current mgmt

## 2014-05-17 NOTE — Progress Notes (Signed)
Mother with vasa previa had questions about breastfeeding.  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 Her first son was in NICU and she pumped.  Provided her with shells to wear after delivery and the NICU booklet to read.  Discussed how we will support her and her pumping within 6 hours of delivery.

## 2014-05-18 LAB — TYPE AND SCREEN
ABO/RH(D): A POS
Antibody Screen: NEGATIVE

## 2014-05-18 NOTE — Progress Notes (Signed)
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

## 2014-05-19 MED ORDER — HYDROCODONE-ACETAMINOPHEN 5-325 MG PO TABS
1.0000 | ORAL_TABLET | Freq: Four times a day (QID) | ORAL | Status: DC | PRN
  Administered 2014-05-19 – 2014-06-21 (×96): 1 via ORAL
  Filled 2014-05-19 (×98): qty 1

## 2014-05-19 NOTE — Progress Notes (Signed)
[redacted]W[redacted]D, vasa previa, cerclage Still with back pain, no other problems Afeb, VSS Will try prn Norco for back

## 2014-05-19 NOTE — Progress Notes (Signed)
Daylight savings time. Time went back 1 hour. 

## 2014-05-20 MED ORDER — BETAMETHASONE SOD PHOS & ACET 6 (3-3) MG/ML IJ SUSP
12.0000 mg | Freq: Every morning | INTRAMUSCULAR | Status: AC
  Administered 2014-05-20 – 2014-05-21 (×2): 12 mg via INTRAMUSCULAR
  Filled 2014-05-20 (×2): qty 2

## 2014-05-20 NOTE — Progress Notes (Signed)
Patient ID: [name], female   DOB: 1986-02-08, 28 y.o.   MRN: [id number]  27+6 vasa previa, incompetent cervix, s/p cerclage, s/p BMZ, will give 2nd round of dosing  No c/o's.  +FM, no LOF, no VB, no ctx.  Sore back  AFVSS  gen NAD Abd soft, NT, gravid  FHTs 145-150, good var, category 1 toco none  Round # 2 of BMZ today and tomorrow Continue current management

## 2014-05-20 NOTE — Progress Notes (Signed)
Ur chart review completed.  

## 2014-05-21 LAB — TYPE AND SCREEN
ABO/RH(D): A POS
Antibody Screen: NEGATIVE

## 2014-05-21 LAB — GLUCOSE TOLERANCE, 1 HOUR: GLUCOSE 1 HOUR GTT: 138 mg/dL (ref 70–140)

## 2014-05-21 NOTE — Progress Notes (Signed)
Patient ID: [name], female   DOB: 1986-01-09, 28 y.o.   MRN: [id number]  28wk - vasa previa, incompetent cervix, cerclage  +FM, no LOF, no VB no ctx.  In second course of BMZ  AFVSS gen NAD FHR 140-150's, good variability, category 1 toco none  Abd soft, FNT, gravid  Continue current care glucola today - phenergan first

## 2014-05-22 MED ORDER — PANTOPRAZOLE SODIUM 40 MG PO TBEC
40.0000 mg | DELAYED_RELEASE_TABLET | Freq: Every day | ORAL | Status: DC
  Administered 2014-05-22 – 2014-06-21 (×31): 40 mg via ORAL
  Filled 2014-05-22 (×31): qty 1

## 2014-05-22 NOTE — Progress Notes (Addendum)
Patient ID: [name], female   DOB: 25-Mar-1986, 28 y.o.   MRN: [id number]   28+1 with vasa previa/incompetent cervix s/p cerclage  glucola yesterday = 138, will schedule 3 hr GTT - next Monday - give time for BMZ effect to pass  No c/o's.  +FM, no LOF, no VB, no ctx  AFVSS gen NAD Abd soft, gravid, NT  FHTs 150's, good var, category 1 toco none  Continue current management Protonix/TUMs for GERD

## 2014-05-23 NOTE — Progress Notes (Addendum)
Patient ID: [name], female   DOB: [birth year], 28 y.o.   MRN: [id number]  28+3 vasa previa, incompetent cervix, cerclage.  S/P BMZ x 2 courses  +FM, no LOF, no VB, no ctx  AFVSS gen NAD Abd soft, gravid, FNT  FHTs 140-150's good var, category 1 toco none  Continue current mgmt

## 2014-05-23 NOTE — Plan of Care (Signed)
Problem: Phase I Progression Outcomes Goal: Bowel movement every 2 days Outcome: Progressing Goal: Contractions < 5-6/hour Outcome: Progressing Goal: Maintains reassuring Fetal Heart Rate Outcome: Progressing Goal: Hemodynamically stable Outcome: Progressing

## 2014-05-24 LAB — TYPE AND SCREEN
ABO/RH(D): A POS
Antibody Screen: NEGATIVE

## 2014-05-24 NOTE — Progress Notes (Signed)
Patient ID: [name], female   DOB: [birth year], 28 y.o.   MRN: [id number]  28+3 vasa previa/incompetent cervix, cerclage  No c/o's except back pain.  +FM, no LOF, no VB, no ctx  AFVSS gen NAD Abd soft, NT, gravid  FHTs 150's, good variability, category 1 toco none  Continue current mgmt, 3 hr GTT monday

## 2014-05-25 NOTE — Plan of Care (Signed)
Problem: Phase I Progression Outcomes Goal: Bowel movement every 2 days Outcome: Progressing Patient taking colace and miralax as prescribed.  Problem: Phase II Progression Outcomes Goal: Medications as ordered (see description) Medications as ordered (Magnesium Sulfate, Steroids, PO Tocolysis, Antibiotics, Terbutaline Pump, Other)  Outcome: Progressing

## 2014-05-25 NOTE — Progress Notes (Signed)
Patient ID: [name], female   DOB: Mar 01, 1986, 28 y.o.   MRN: [id number]  28+4 vasa previa/incompetent cervix, cerclage  Pt just waking up, feels fine FHR tracings Category 1  Gravid NT  Plan 3 hour GTT on 11/9 Continue current care.

## 2014-05-26 MED ORDER — TETANUS-DIPHTH-ACELL PERTUSSIS 5-2.5-18.5 LF-MCG/0.5 IM SUSP
0.5000 mL | Freq: Once | INTRAMUSCULAR | Status: AC
  Administered 2014-05-26: 0.5 mL via INTRAMUSCULAR
  Filled 2014-05-26: qty 0.5

## 2014-05-26 NOTE — Progress Notes (Signed)
Patient ID: [name], female   DOB: 10/22/1985, 28 y.o.   MRN: [id number] 28 5/7  Vasa previa/ incompetent cervix/ cerclage  Pt doing well.  No c/o.  Asks about TDAP and repeating one hour GTT FHR tracings Category 1, no ctx  Fundus NT  TDAP today Pt states had received betamethasone Morello prior to one hour GTT and wonders if could have changed the result. It is possible, told her she can ask Dr. Ellyn HackBovard if she can repeat the one hour before having to do the 3 hour and see what she thinks.

## 2014-05-26 NOTE — Progress Notes (Signed)
Pt called out to see RN for vaginal bleeding. When RN arrived in the pt room the pt states "I think I am bleeding, I'm scared". RN ask to see the blood and the pt said she did not save it. The pt told the RN it was a small streak when she wiped. The Rn instructed the pt to save the bloody show for the RN to see the next time. FHR tracing applied, will continue to monitor and assess.

## 2014-05-27 LAB — GLUCOSE TOLERANCE, 1 HOUR: Glucose, 1 Hour GTT: 137 mg/dL (ref 70–140)

## 2014-05-27 LAB — TYPE AND SCREEN
ABO/RH(D): A POS
Antibody Screen: NEGATIVE

## 2014-05-27 NOTE — Progress Notes (Signed)
Patient ID: Shanda BumpsJessica Guzzi, female   DOB: 14-May-1986, 28 y.o.   MRN: [id number]  28+6 - vasa previa, incompetent cervix, s/p cerclage and BMZ x 2  No co's.  +FM, no LOF, no VB, spot of blood yesterday, no ctx  AFVSS gen NAD Abd soft, gravid, NT  FHTs 140's, good var, category 1 toco none  Pt to repeat glucola today, first was after BMZ  Continue current mgmt

## 2014-05-27 NOTE — Progress Notes (Signed)
I spent close to an hour speaking with pt.  She shared about dynamics in her relationships with her family and her thoughts about becoming a mother.  She has a complicated social history and has made some significant changes in her lifestyle over the last several years.    We will continue to provide emotional and spiritual support when we are able, but please also page as needs arise.  Centex CorporationChaplain Katy Zendayah Hardgrave Pager, [phone] 4:32 PM    05/27/14 1600  Clinical Encounter Type  Visited With Patient  Visit Type Spiritual support  Spiritual Encounters  Spiritual Needs Emotional

## 2014-05-27 NOTE — Progress Notes (Signed)
Ur chart review completed.  

## 2014-05-28 LAB — GLUCOSE, 3 HOUR GESTATIONAL: Glucose, GTT - 3 Hour: 146 mg/dL — ABNORMAL HIGH (ref 70–144)

## 2014-05-28 LAB — GLUCOSE, 1 HOUR GESTATIONAL: Glucose Tolerance, 1 hour: 169 mg/dL (ref 70–189)

## 2014-05-28 LAB — GLUCOSE, 2 HOUR GESTATIONAL: GLUCOSE, 2 HOUR-GESTATIONAL: 116 mg/dL (ref 70–164)

## 2014-05-28 LAB — GLUCOSE, FASTING GESTATIONAL: Glucose Tolerance, Fasting: 78 mg/dL

## 2014-05-28 NOTE — Progress Notes (Signed)
Patient ID: Shanda BumpsJessica Forgue, female   DOB: June 17, 1986, 28 y.o.   MRN: [id number]  29wk with vasa previa/incompetent cervix/cerclage s/p BMZ x 2 no c/o's  +FM, no LOF, no VB, no ctx  AFVSS gen NAD Abd soft, gravid NT  FHTs 140-150 good var, category 1 toco none  Cont current mgmt glucola 137 - will do 3 hr GTT

## 2014-05-28 NOTE — Progress Notes (Signed)
Patient ID: Mariah BumpsJessica Show, female   DOB: 10-14-1985, 28 y.o.   MRN: [id number]  3hr GTT today, only elevated was 3hr at 147, mildly elevated (-145) Will repeat at 32 weeks

## 2014-05-29 NOTE — Progress Notes (Signed)
Patient ID: [name], female   DOB: 1986-01-29, 28 y.o.   MRN: [id number]  29+ vasa previa/incompetent cervix/cerclage/ s/p BMZ x 2. Also elevated glucola, 3 hr with one elevated value, repeat at 32 week  AFVSS gen NAD Abd soft, FNT, gravid  FHTs 130-140's good var, category 1 toco none  Continue current mgmt

## 2014-05-30 ENCOUNTER — Other Ambulatory Visit (HOSPITAL_COMMUNITY): Payer: Self-pay

## 2014-05-30 ENCOUNTER — Encounter (HOSPITAL_COMMUNITY): Payer: Self-pay | Admitting: *Deleted

## 2014-05-30 LAB — TYPE AND SCREEN
ABO/RH(D): A POS
Antibody Screen: NEGATIVE

## 2014-05-30 NOTE — Progress Notes (Signed)
Patient ID: Shanda BumpsJessica Armijo, female   DOB: 1986-07-08, 28 y.o.   MRN: [id number]  29+2 vasa previa/incompetent cervix/ cerclage; s/p BMZ x 2, due for repeat 3 hr GTT at 32 weeks  No c/o's.  +FM, no LOF, no VB, no ctx  AFVSS gen NAD Abd soft, gravid, NT  FHTs 140's, good var, category 1 toco none  Continue current mgmt

## 2014-05-31 NOTE — Progress Notes (Signed)
Patient ID: [name], female   DOB: [birth year], 28 y.o.   MRN: [id number]  29+3 vasa previa/incompetent cervix/ cerclage, s/p BMZ, repeat 3hr GTT on Monday  No c/o's.  +FM, no LOF,no VB, rare ctx  AFVSS gen NAD abd soft, gravid, NT  FHTs 140's, good var, category 1 toco rare  Cont current mgmt

## 2014-06-01 NOTE — Progress Notes (Signed)
Patient ID: [name], female   DOB: Sep 13, 1985, 28 y.o.   MRN: [id number] No problems Tracing looks normal [redacted] weeks GA

## 2014-06-02 LAB — TYPE AND SCREEN
ABO/RH(D): A POS
ANTIBODY SCREEN: NEGATIVE

## 2014-06-02 NOTE — Progress Notes (Signed)
Patient ID: [name], female   DOB: 1986-01-04, 28 y.o.   MRN: [id number] Afebrile BP normal Tracing normal

## 2014-06-03 LAB — GLUCOSE, FASTING GESTATIONAL: GLUCOSE, FASTING-GESTATIONAL: 78 mg/dL

## 2014-06-03 LAB — GROUP B STREP BY PCR: GROUP B STREP BY PCR: NEGATIVE

## 2014-06-03 MED ORDER — OXYMETAZOLINE HCL 0.05 % NA SOLN
1.0000 | Freq: Two times a day (BID) | NASAL | Status: DC | PRN
  Filled 2014-06-03: qty 15

## 2014-06-03 NOTE — Progress Notes (Signed)
Late Entry: CSW met with patient in her Antenatal room to follow up on previous visits to see how she is coping with prolonged hospitalization.  Patient was in very high spirits and welcoming of CSW's visit.  She was very talkative and seemed to appreciate the visit and CSW's concern for her continued emotional well-being.  Patient states she has resumed taking Zoloft and states it "helps amazingly."  CSW commended her for recognizing the need to restart an antidepressant in order to help her cope with her current situation.  She states she is no longer having any panic attacks.  She admits to having emotional days, but feels this is normal and most likely hormonal.  CSW normalized and validated these feelings.  Patient states she met with one of the Chaplains yesterday and states it felt "phenomenal" to get everything off her chest and that it was like "20 years of self-realization."  CSW stated that it is great that she is using her resources while she is here.  She replied that she thinks she is a better person from this experience.  She states she feels her marriage has become stronger through this.  She told her husband about the conversation with the Chaplain and he initially told her that he hoped she did not "become all Jesus on him."  CSW asked her how that made her feel.  She states he was joking, but that he told her he loves her for exactly who she is and doesn't want her to change.  She said she needed to hear this and that it meant a lot to her.  She states her mother in law has lived with them since 2 months after they got married so they have never really had the chance to be alone in their marriage.  She states this hospitalization has given them the opportunity to be alone without her "telling us how to live and run our marriage."  Patient reports doing "soul searching" while being in the hospital.  Patient states that she is ok with her Mother in law living in the house because it is important to  her husband that he take care of his mother.  She has come to realize that she likes the company while her husband is at work and that will be extra nice once the baby comes to not be in the house by herself.  Patient stated that she does not like to be alone and that she feels dependent on her husband for attention.  CSW spoke more about this with patient.  CSW asked if patient ever feels controlled by her husband or less than equal to him, possibly due to the age difference, as patient is 64 and her husband is 36.  Patient denies feeling controlled by her husband, but states his seniority makes her feel grounded, as she can sometimes be too spontaneous.  She states she sometimes wishes he was more sympathetic, but overall describes their relationship as happy, playful, with good communication.  CSW stressed the importance of communication throughout the remainder of her hospitalization and with bringing a baby into their home.  Patient agrees.  CSW feels CSW gave patient ample opportunity to discuss both positive and negative aspects to her hospitalization and relationship with FOB today.  Patient states no concerns regarding emotional state and or relationship at this time.  She is aware that CSW is available to process her feelings at any time while she remains in the hospital or if baby is  admitted to the NICU at birth.  Patient stated thoughts on when baby would be born and hopes that he would not have to go to the NICU.  CSW cautioned patient to not have any expectations regarding whether or not he would be admitted to NICU at birth so that she is not let down if he needs extra time in the hospital.  CSW encouraged her that she can handle that since she herself has handled such a long hospitalization.  She agreed.  CSW again wrote CSW's contact information on patient's board and asked her to call anytime.  She thanked CSW.  CSW thanked patient for sharing today.

## 2014-06-03 NOTE — Progress Notes (Signed)
Ur chart review completed.  

## 2014-06-03 NOTE — Progress Notes (Signed)
Patient ID: Shanda BumpsJessica Dutta, female   DOB: 02-May-1986, 28 y.o.   MRN: [id number]  29+6 vasa previa/incompetent cervix/cerclage, s/p BMZ x 2  No c/o's.  +FM, no LOF, no VB, no ctx  AFVSS gen NAD Abd soft, gravid, NT  FHTs 135-145 good variability, category 1 toco none  Continue current mgmt Repeat GBBS

## 2014-06-04 NOTE — Progress Notes (Signed)
Patient ID: [name], female   DOB: November 16, 1985, 28 y.o.   MRN: [id number]  30wk!!!  Vasa previa/incompetent cervix/ s/p cerclage  No c/o's.  +FM, no LOF, no VB, occ ctx  AFVSS gen NAD Abd soft, gravid, NT  130-140 good var, category 1 toco none  continuecurrent mgmt US for growth in next several days

## 2014-06-04 NOTE — Plan of Care (Signed)
Problem: Phase I Progression Outcomes Goal: Bowel movement every 2 days Outcome: Progressing Patient taking colace and miralax as prescribed.

## 2014-06-04 NOTE — Plan of Care (Signed)
Problem: Phase II Progression Outcomes Goal: Medications as ordered (see description) Medications as ordered (Magnesium Sulfate, Steroids, PO Tocolysis, Antibiotics, Terbutaline Pump, Other)  Outcome: Progressing

## 2014-06-04 NOTE — Progress Notes (Signed)
   06/04/14 1700  Clinical Encounter Type  Visited With Patient  Visit Type Spiritual support;Social support  Spiritual Encounters  Spiritual Needs Emotional   Followed up to offer further emotional support after pt had a lengthy initial visit with Cent[employer] CorporationChaplain Katy Claussen.  Provided pastoral presence for >1 hour as she shared, unpacked, processed, and reflected on many significant life events, relationships, stressors, and hopes.  Pt verbalized deep gratitude for safe space and opportunity to share without judgment.  Spiritual Care will continue to follow for support, but please also page as needed:  [phone].  Thank you.  [address]Chaplain Keyaira Clapham [place], [place] [phone]

## 2014-06-05 LAB — TYPE AND SCREEN
ABO/RH(D): A POS
Antibody Screen: NEGATIVE

## 2014-06-05 NOTE — Progress Notes (Signed)
Patient ID: Shanda BumpsJessica Men, female   DOB: 1986/06/13, 28 y.o.   MRN: [id number]  30+1 vasa previa/incompetent cervix/cerclage, s/p BMZ x 2, for repeat GTT at 32 week  No c/o's.  +FM, no LOF, no VB, no ctx  AFVSS gen NAD Abd soft, gravid, NT  FHTs 130-140's, good var, category 1 toco occ  Continue current mgmt

## 2014-06-06 ENCOUNTER — Inpatient Hospital Stay (HOSPITAL_COMMUNITY): Payer: 59

## 2014-06-06 DIAGNOSIS — O3433 Maternal care for cervical incompetence, third trimester: Secondary | ICD-10-CM | POA: Insufficient documentation

## 2014-06-06 DIAGNOSIS — Z3A30 30 weeks gestation of pregnancy: Secondary | ICD-10-CM | POA: Insufficient documentation

## 2014-06-06 NOTE — Progress Notes (Signed)
Patient ID: [name], female   DOB: July 15, 1986, 28 y.o.   MRN: [id number]  30+ vasa previz/incompetent cervix/cerclage, s/p BMZ x 2, repeat 3 hr GTT 32 weeks  No c/o's.  +FM, no LOF, no VB, no ctx  AFVSS gen NAD Abd soft, gravid, NT  FHTs 130-140's good var, category 1 toco occ  Continue current mgmt

## 2014-06-07 NOTE — Progress Notes (Signed)
Patient ID: [name], female   DOB: Oct 24, 1985, 28 y.o.   MRN: [id number]   30+ vasa previa/incompetent cervix/s/p cerclage, s/p BMZ x 2.  Repeat 3hr GTT 32 weeks  No c/o's.  +FM, no LOF, no VB, no ctx  gen NAD Abd soft, gravid, NT  130-140 category 1, good variability toco occ  Continue current mgmt US yesterday.  Vessels moving further from os, not true vasa previa, post plac, anterior lobe, CL = 1.3cm, nl AFI Will d/w MFM delivery plan

## 2014-06-07 NOTE — Progress Notes (Signed)
Pt informed too soon for pain med, will have to wait until 1522.

## 2014-06-07 NOTE — Plan of Care (Signed)
Problem: Phase I Progression Outcomes Goal: Bowel movement every 2 days Outcome: Progressing     

## 2014-06-07 NOTE — Plan of Care (Signed)
Problem: Phase I Progression Outcomes Goal: Contractions < 5-6/hour Outcome: Progressing     

## 2014-06-07 NOTE — Progress Notes (Signed)
Patient ID: Mariah BumpsJessica Eschete, female   DOB: January 08, 1986, 28 y.o.   MRN: [id number]  LTCS scheduled for Jul 09, 2014 35 wk

## 2014-06-07 NOTE — Progress Notes (Signed)
Attempted follow-up, but patient was trying to nap.  Will attempt follow up next week.  [employer] CorporationChaplain Katy Kikuye Korenek Pager, [phone] 12:40 PM    06/07/14 1200  Clinical Encounter Type  Visited With Patient not available

## 2014-06-08 NOTE — Plan of Care (Signed)
Problem: Phase I Progression Outcomes Goal: Contractions < 5-6/hour Outcome: Progressing Goal: Maintains reassuring Fetal Heart Rate Outcome: Progressing

## 2014-06-08 NOTE — Progress Notes (Signed)
Patient ID: [name], female   DOB: February 10, 1986, 28 y.o.   MRN: [id number]  30+ vasa previz/incompetent cervix/cerclage, s/p BMZ x 2, 3hr GTT at 32 weeks  No c/o's, +FM, no LOF, no VB, no ctx  AFVSS gen NAD Abd soft, gravid, NT  FHT 130-140, good var, category 1 toco occ  Continue current mgmt

## 2014-06-09 LAB — TYPE AND SCREEN
ABO/RH(D): A POS
Antibody Screen: NEGATIVE

## 2014-06-09 NOTE — Progress Notes (Signed)
Patient ID: [name], female   DOB: 1986/05/05, 28 y.o.   MRN: [id number]  30+5 with vasa previa/incompetent cervix. S/p cerclage; s/p BMZ x 2, 3hr GTT repeat 32wk  No c/o's, +FM, no LOF, no VB, occ/rare ctx  AFVSS gen NAD Abd soft, gravid, NT  FHTs 130-140 category 1, good variability toco rare  Continue current mgmt

## 2014-06-10 NOTE — Progress Notes (Signed)
Ms Bingaman requested that I come back another time because her husband was sleeping on the couch.  I will attempt follow-up visit later in the week.  [employer] CorporationChaplain Katy Yosselin Zoeller Pager, [phone] 10:58 AM    06/10/14 1000  Clinical Encounter Type  Visited With Patient  Visit Type Follow-up

## 2014-06-10 NOTE — Progress Notes (Signed)
Patient ID: Shanda BumpsJessica Mewborn, female   DOB: [birth year], 28 y.o.   MRN: [id number]  30+6 vasa previa, incompetent cervix, cerclage, s/p BMZ x 2, repeat 3 hr GTT at 32 weeks  No c/o's.  +FM, no LOF, no VB, rare ctx  AF VSS gen NAD Abd soft, gravid, NT  FHTs 130-140's, good variability, category 1 toco rare  Continue current mgmt

## 2014-06-10 NOTE — Progress Notes (Signed)
Ur chart review completed.  

## 2014-06-11 LAB — TYPE AND SCREEN
ABO/RH(D): A POS
ANTIBODY SCREEN: NEGATIVE

## 2014-06-11 NOTE — Progress Notes (Signed)
Pt informed too soon for pain med, must wait 30 minutes.  Pt verbalized understanding & agrees to wait.

## 2014-06-11 NOTE — Progress Notes (Signed)
Patient ID: [name], female   DOB: May 13, 1986, 28 y.o.   MRN: [id number]  31weeks! Vasa previa, resolving, incompetent cervix/ s/p cerclage; s/p BMZ x 2, repeat 3hr GTT next week  No c/o's, +FM, no LOF, no VB, no ctx  AFVSS gen NAD Abd soft, NT, gravid  FHTs 120-140's, category 1, good var toco none  Continue current mgmt

## 2014-06-11 NOTE — Plan of Care (Signed)
Problem: Phase I Progression Outcomes Goal: Hemodynamically stable Outcome: Progressing Potential for hemorrhage due to dx of vasaprevia.

## 2014-06-12 NOTE — Progress Notes (Signed)
I spent over an hour and a half with [name]. She is processing family dynamics, personal history and her own coping mechanisms and is finding conversation with Spiritual Care and Social Work to be very helpful with these.    We will continue to follow up while she is here in the hospital, but please always page as needs arise.  [employer] CorporationChaplain Katy Noemi Bellissimo Pager, [phone] 4:40 PM

## 2014-06-12 NOTE — Progress Notes (Signed)
Patient ID: Shanda BumpsJessica Kimm, female   DOB: 1986/04/01, 28 y.o.   MRN: [id number]  31+ - vasa previa, incompetent cervix, s/p cerclage.  S/p BMZ x2, repeat 3hr at 32 weeks  No c/o's.  +FM, no LOF, no VB, no ctx  AFVSS gen NAD Abd soft, gravid, NT  FHTs 120-140's, category 1, good variability toco rare  Continue current mgmt

## 2014-06-13 NOTE — Progress Notes (Signed)
Patient ID: [name], female   DOB: [birth year], 28 y.o.   MRN: [id number] 31 2/7  vasa previa, incompetent cervix, s/p cerclage. S/p BMZ x2, repeat 3hr at 32 weeks  Doing well. +FM, no LOF, no VB, no ctx  AFVSS gen NAD Abd soft, gravid, NT  FHTs 120-140's, category 1 toco rare  Continue current mgmt

## 2014-06-13 NOTE — Progress Notes (Signed)
Pt escorted to hospital entrance via W/C to visit family.  Pt left with family, will call RN in 20-30 minutes when ready to return to unit.

## 2014-06-13 NOTE — Plan of Care (Signed)
Problem: Phase I Progression Outcomes Goal: No significant worsening bleeding/cervix change/vag drainage No significant worsening in vaginal bleeding, cervical change, or vaginal drainage. Outcome: Progressing     

## 2014-06-13 NOTE — Progress Notes (Signed)
Pt has returned from visiting family

## 2014-06-13 NOTE — Plan of Care (Signed)
Problem: Phase I Progression Outcomes Goal: Bowel movement every 2 days Outcome: Completed/Met Date Met:  06/13/14

## 2014-06-14 LAB — TYPE AND SCREEN
ABO/RH(D): A POS
ANTIBODY SCREEN: NEGATIVE

## 2014-06-14 NOTE — Progress Notes (Signed)
Patient ID: [name], female   DOB: 03/10/1986, 28 y.o.   MRN: [id number]  31+ vasa previz/incompetent cervix s/p cerclage, s/p BMX x 2; repeat 3 hr GTT at 32 weeks, US next week  No c/o's.  +FM, no LOF, no VB, no ctx  AFVSS gen NAD Abd soft, gravid, NT  FHTs 130-140's, good var, category 1 toco occ/some irritability  Cont current mgmt

## 2014-06-15 NOTE — Progress Notes (Signed)
Pt off the monitor after reassurring FHR  

## 2014-06-15 NOTE — Progress Notes (Signed)
[redacted]W[redacted]D, vasa previa, cerclage No problems, nothing new Afeb, VSS FHT- reactive NSTs Continue current care

## 2014-06-16 NOTE — Accreditation Note (Signed)
[redacted]W[redacted]D, vasa previa, cerclage No problems, nothing new Afeb, VSS NST reactive Continue current care

## 2014-06-16 NOTE — Progress Notes (Signed)
Pt off the monitor.

## 2014-06-17 LAB — TYPE AND SCREEN
ABO/RH(D): A POS
ANTIBODY SCREEN: NEGATIVE

## 2014-06-17 NOTE — Progress Notes (Signed)
Patient ID: [name], female   DOB: 21-Mar-1986, 28 y.o.   MRN: [id number]  31+6, vasa previa, incompetent cervix s/p cerclage, s/p BMZ x2, for repeat 3hr GTT tomorrow  No c/o's.  +FM, no LOF< no VB, no ctx  AFVSS gen NAD Abd soft, gravid, NT  FHTs 120-150's, good variability, category 1 toco rare  Continue current mgmt

## 2014-06-17 NOTE — Progress Notes (Signed)
Pt off the monitor after reassurring FHR  

## 2014-06-18 LAB — GLUCOSE, 2 HOUR GESTATIONAL: Glucose Tolerance, 2 hour: 167 mg/dL — ABNORMAL HIGH (ref 70–164)

## 2014-06-18 LAB — GLUCOSE, FASTING GESTATIONAL: Glucose Tolerance, Fasting: 90 mg/dL

## 2014-06-18 LAB — GLUCOSE, 1 HOUR GESTATIONAL: GLUCOSE, 1 HOUR-GESTATIONAL: 180 mg/dL (ref 70–189)

## 2014-06-18 LAB — GLUCOSE, 3 HOUR GESTATIONAL: GLUCOSE 3 HOUR GTT: 165 mg/dL — AB (ref 70–144)

## 2014-06-18 NOTE — Progress Notes (Signed)
   06/18/14 1700  Clinical Encounter Type  Visited With Patient  Visit Type Follow-up;Spiritual support;Social support  Spiritual Encounters  Spiritual Needs Emotional  Stress Factors  Patient Stress Factors Loss of control;Major life changes   Followed up to offer further support today, which [name] welcomed particularly because of her struggle with new gestational diabetes dx.  She used visit well to process her feelings about this most recent loss of control and others (related to hospital stay and parenting).  We talked in detail about areas in which she does have some control, agency, choice, or involvement in planning.  Honored both the losses of control and opportunity (such as shopping for baby items with her husband) and the little rites of passage that she does get to participate in (such as shopping/searching for baby items online with him).  Pt very appreciative and reporting less distress/despair at end of visit.  [place] is following, but please also page as needs arise:  [phone].  Thank you.  [address]Cha[address] Jazel Nimmons [place], [place] [phone]

## 2014-06-18 NOTE — Progress Notes (Signed)
Patient ID: Shanda BumpsJessica Elling, female   DOB: 1985-11-19, [age] y.o.   MRN: [id number]  32week!  Repeat 3 hr GTT.  Vasa previa/incompetent cervix/ s/p cerclage/ s/p BMZ x 2  No c/o's.  +FM, no LOF, no VB, no ctx  AF VSS gen NAD Abd soft, gravid, NT  FHTs 130's, category 1, good variability toco occ  Continue current mgmt

## 2014-06-19 LAB — GLUCOSE, CAPILLARY
GLUCOSE-CAPILLARY: 119 mg/dL — AB (ref 70–99)
GLUCOSE-CAPILLARY: 93 mg/dL (ref 70–99)
Glucose-Capillary: 86 mg/dL (ref 70–99)
Glucose-Capillary: 110 mg/dL — ABNORMAL HIGH (ref 70–99)

## 2014-06-19 NOTE — Plan of Care (Signed)
Problem: Consults Goal: Nutrition Consult-if indicated Outcome: Completed/Met Date Met:  06/19/14

## 2014-06-19 NOTE — Progress Notes (Signed)
  Nutrition Dx: Food and nutrition-related knowledge deficit r/t no previous education aeb newly diagnosed GDM.   Nutrition education consult for Carbohydrate Modified Gestational Diabetic Diet completed.  "Meal  plan for gestational diabetics" handout given to patient.  Basic concepts reviewed. Discussed implications for baby, glucose goals, timing of meals, high CHO foods, menu ordering.  Pt was not taught CHO counting as she will be in house until delivery. SRC will count CHO's for pt when she orders.  Questions answered.  Patient verbalizes understanding. She is very emotional about Dx and feels as if this is one more thing that has been removed from her control, ability to order/consume whatever food she wishes. RD validated pts feelings.  Elisabeth CaraKatherine Onesha Krebbs M.Odis LusterEd. R.D. LDN Neonatal Nutrition Support Specialist/RD III Pager [phone]

## 2014-06-19 NOTE — Progress Notes (Signed)
Patient ID: Shanda BumpsJessica Higham, female   DOB: 1985-12-25, 28 y.o.   MRN: [id number]  32+1 vasa previa, incompetent cervix, s/p cerclage, s/p BMZ x2, GDM  AFVSS gen NAD Abd soft, gravid, NT  FHTs 120-150's, good variability, category 1 toco occ  D/W pt 3 hr results with 2 abnormals, so GDM - d/w pt diet and FSBS Will check fasting a 1 hr PP  Continue current mgmt

## 2014-06-19 NOTE — Plan of Care (Signed)
Problem: Consults Goal: Diabetes Guidelines per MD order/protocol Outcome: Progressing Diabetic consult ordered; pt given educational booklet regarding gestational diabetes

## 2014-06-19 NOTE — Progress Notes (Signed)
[name] is having a difficult time with her new diagnosis.  She is anxious that she will be the rare case in which this does not end after pregnancy and she is anxious at losing control over yet another aspect of her life.  I offered support, listening presence and emotional and spiritual companionship.   will continue to follow, but please page as needs arise.  [employer] CorporationChaplain [name] Pager, [phone] 3:18 PM    06/19/14 1500  Clinical Encounter Type  Visited With Patient  Visit Type Spiritual support  Spiritual Encounters  Spiritual Needs Emotional  Stress Factors  Patient Stress Factors Loss of control

## 2014-06-20 ENCOUNTER — Inpatient Hospital Stay (HOSPITAL_COMMUNITY)
Admit: 2014-06-20 | Discharge: 2014-06-20 | Disposition: A | Discharge status: Home / Self Care | Payer: 59 | Attending: Obstetrics and Gynecology | Admitting: Obstetrics and Gynecology

## 2014-06-20 DIAGNOSIS — O4400 Placenta previa specified as without hemorrhage, unspecified trimester: Secondary | ICD-10-CM | POA: Insufficient documentation

## 2014-06-20 DIAGNOSIS — Z8751 Personal history of pre-term labor: Secondary | ICD-10-CM | POA: Insufficient documentation

## 2014-06-20 DIAGNOSIS — O4420 Partial placenta previa NOS or without hemorrhage, unspecified trimester: Secondary | ICD-10-CM

## 2014-06-20 DIAGNOSIS — O3433 Maternal care for cervical incompetence, third trimester: Secondary | ICD-10-CM | POA: Insufficient documentation

## 2014-06-20 LAB — GLUCOSE, CAPILLARY
GLUCOSE-CAPILLARY: 86 mg/dL (ref 70–99)
GLUCOSE-CAPILLARY: 91 mg/dL (ref 70–99)
Glucose-Capillary: 81 mg/dL (ref 70–99)
Glucose-Capillary: 93 mg/dL (ref 70–99)

## 2014-06-20 LAB — TYPE AND SCREEN
ABO/RH(D): A POS
Antibody Screen: NEGATIVE

## 2014-06-20 NOTE — Progress Notes (Signed)
Spoke with patient about new gestational diabetes diagnosis. Discussed results of oral glucose tolerance test and explained criteria for diagnosis of GDM. Reviewed Managing Gestational Diabetes booklet with the patient. Discussed basic pathophysiology of GDM, glycemic control treatment options during pregnancy, importance of glycemic control for healthy pregnancy, potential complications related to uncontrolled diabetes, glucose goals during pregnancy, and importance of monitoring glucose to determine trends and treatments.  Discussed impact of nutrition, exercise, stress, sickness, and medications on diabetes control. Patient reports that the dietician has meet with her and she understands how carbohydrates impact glucose control. Patient states that she feels she will be able to follow nutrition guidelines without any problems.  Encouraged patient to review the Managing Gestational Diabetes booklet and talk with nursing staff if she has any questions.  Patient verbalized understanding of information discussed and she states that she has no further questions at this time related to diabetes. Will continue to follow glycemic control while inpatient. Please contact Diabetes Coordinator for any additional questions or concerns.  Thanks, Mariah PennerMarie Aariel Ems, RN, MSN, CCRN Diabetes Coordinator Inpatient Diabetes Program [phone] (Team Pager) [phone] ([facility] office) [phone] [facility] office)

## 2014-06-20 NOTE — Progress Notes (Signed)
Patient ID: [name], female   DOB: 1985-08-21, 28 y.o.   MRN: [id number]  32+2 vasa previa, incompetent cervix, s/p cerclage, s/p BMZ x 2, GDM  No c/o's.  US this AM.  +FM, no LOF, no VB, occ ctx  AFVSS gen NAD Abd soft, gravid, NT  FHTs 130-150, good var, category 1 toco occ  Continue current mgmt

## 2014-06-21 ENCOUNTER — Encounter (HOSPITAL_COMMUNITY): Payer: Self-pay | Admitting: Obstetrics and Gynecology

## 2014-06-21 DIAGNOSIS — O4420 Partial placenta previa NOS or without hemorrhage, unspecified trimester: Secondary | ICD-10-CM

## 2014-06-21 HISTORY — DX: Partial placenta previa nos or without hemorrhage, unspecified trimester: O44.20

## 2014-06-21 LAB — GLUCOSE, CAPILLARY
Glucose-Capillary: 74 mg/dL (ref 70–99)
Glucose-Capillary: 124 mg/dL — ABNORMAL HIGH (ref 70–99)

## 2014-06-21 MED ORDER — SERTRALINE HCL 50 MG PO TABS: 50.0000 mg | ORAL_TABLET | Freq: Every day | ORAL | Status: AC

## 2014-06-21 MED ORDER — PRENATAL MULTIVITAMIN CH: 1.0000 | ORAL_TABLET | Freq: Every day | ORAL | Status: DC

## 2014-06-21 MED ORDER — HYDROCODONE-ACETAMINOPHEN 5-325 MG PO TABS: 1.0000 | ORAL_TABLET | Freq: Four times a day (QID) | ORAL | Status: DC | PRN

## 2014-06-21 MED ORDER — CYCLOBENZAPRINE HCL 10 MG PO TABS: 10.0000 mg | ORAL_TABLET | Freq: Three times a day (TID) | ORAL | Status: DC | PRN

## 2014-06-21 MED ORDER — POLYETHYLENE GLYCOL 3350 17 G PO PACK: 17.0000 g | PACK | Freq: Every day | ORAL | Status: AC

## 2014-06-21 MED ORDER — PROGESTERONE MICRONIZED 200 MG PO CAPS: 200.0000 mg | ORAL_CAPSULE | Freq: Every day | ORAL | Status: DC

## 2014-06-21 NOTE — Discharge Summary (Signed)
Obstetric Discharge Summary Reason for Admission: vasa previa Prenatal Procedures: NST, ultrasound and PICC line placement, 3hr GTT Intrapartum Procedures:  N/A Postpartum Procedures: N/A Complications-Operative and Postpartum: N/A HEMOGLOBIN  Date Value Ref Range Status  04/18/2014 10.8* 12.0 - 15.0 g/dL Final   HCT  Date Value Ref Range Status  04/18/2014 32.1* 36.0 - 46.0 % Final    Physical Exam:  General: alert and no distress R NST Abd NT, gravid  Discharge Diagnoses: resolved vasa previa, marginal placenta previa, GDM  Discharge Information: Date: 06/21/2014 Activity: pelvic rest Diet: routine and (carb modified) Medications: PNV, Vicodin, Zoloft, vaginal progesterone; PRN miralax, Vicodin Condition: stable Instructions: As per discussions, call with questions Discharge to: home Follow-up Information    Follow up with Sherian ReinBovard-Stuckert, Amiliana Foutz, MD On 06/24/2014.   Specialty:  Obstetrics and Gynecology   Why:  at 2:30pm; MFM US 06/25/14 at 8:30am [phone]   Contact information:   [address] [place] [address] [phone]        [name], Augusto GambleJody 06/21/2014, 1:25 PM

## 2014-06-21 NOTE — Discharge Instructions (Signed)
Preterm Labor Information Preterm labor is when labor starts before you are [redacted] weeks pregnant. The normal length of pregnancy is 39 to 41 weeks.  CAUSES  The cause of preterm labor is not often known. The most common known cause is infection. RISK FACTORS  Having a history of preterm labor.  Having your water break before it should.  Having a placenta that covers the opening of the cervix.  Having a placenta that breaks away from the uterus.  Having a cervix that is too weak to hold the baby in the uterus.  Having too much fluid in the amniotic sac.  Taking drugs or smoking while pregnant.  Not gaining enough weight while pregnant.  Being younger than 18 and older than 26 years old.  Having a low income.  Being African American. SYMPTOMS  Period-like cramps, belly (abdominal) pain, or back pain.  Contractions that are regular, as often as six in an hour. They may be mild or painful.  Contractions that start at the top of the belly. They then move to the lower belly and back.  Lower belly pressure that seems to get stronger.  Bleeding from the vagina.  Fluid leaking from the vagina. TREATMENT  Treatment depends on:  Your condition.  The condition of your baby.  How many weeks pregnant you are. Your doctor may have you:  Take medicine to stop contractions.  Stay in bed except to use the restroom (bed rest).  Stay in the hospital. WHAT SHOULD YOU DO IF YOU THINK YOU ARE IN PRETERM LABOR? Call your doctor right away. You need to go to the hospital right away.  HOW CAN YOU PREVENT PRETERM LABOR IN FUTURE PREGNANCIES?  Stop smoking, if you smoke.  Maintain healthy weight gain.  Do not take drugs or be around chemicals that are not needed.  Tell your doctor if you think you have an infection.  Tell your doctor if you had a preterm labor before. Document Released: 10/01/2008 Document Revised: 04/25/2013 Document Reviewed: 10/01/2008 ExitCare Patient  Information 2015 [employer]. This information is not intended to replace advice given to you by your health care provider. Make sure you discuss any questions you have with your health care provider.  

## 2014-06-21 NOTE — Progress Notes (Signed)
Patient ID: [name], female   DOB: 27-Dec-1985, 28 y.o.   MRN: [id number]  32+wk resolved vasa previa/marginal placenta previa, incompetent cervix; s/p BMZ x 2, GDM  No c/o's, +FM, no LOF, no VB, occ ctx  AFVSS gen NAD Abd soft, gravid, NT  FHTs 120-140's, good variability, category 1 toco occ  Continue current mgmt Long discussion with MFM and pt re US findings and POC

## 2014-06-21 NOTE — Progress Notes (Signed)
I saw [name] this morning while rounding on the unit.  She was both excited and nervous about being discharged.  I gave her space to process some of her feelings and encouraged her to utilize some stress-relief techniques.  We will follow up when she is back in the hospital!  [facility] ChildrenChaplain Katy Chaka Jefferys Pager, [phone] 3:24 PM    06/21/14 1500  Clinical Encounter Type  Visited With Patient  Visit Type Follow-up  Spiritual Encounters  Spiritual Needs Emotional

## 2014-06-21 NOTE — Progress Notes (Signed)
Patient ID: [name], female   DOB: [birth year], 28 y.o.   MRN: [id number]  32+2 - vasa previa, resolved; marginal placenta previa; GDM, incompetent cervix, s/p cerclage, s/p BMZ x 2  No c/o's.  After discussion of plan of care and resolving vasa previa, pt desires d/c to home Will d/c with routine prenatal instructions.   F/U US 12/8 8:30am Has a glucometer at home, will check for strips  Appt 06/24/14 2:30pm at office

## 2014-06-21 NOTE — Progress Notes (Signed)
Discharge instructions given to patient. Receptive to instructions.

## 2014-06-25 ENCOUNTER — Ambulatory Visit (HOSPITAL_COMMUNITY)
Admit: 2014-06-25 | Discharge: 2014-06-25 | Disposition: A | Discharge status: Home / Self Care | Payer: 59 | Attending: Obstetrics and Gynecology | Admitting: Obstetrics and Gynecology

## 2014-06-25 ENCOUNTER — Other Ambulatory Visit (HOSPITAL_COMMUNITY): Payer: 59 | Admitting: Obstetrics and Gynecology

## 2014-06-25 ENCOUNTER — Encounter (HOSPITAL_COMMUNITY): Payer: Self-pay | Admitting: *Deleted

## 2014-06-25 ENCOUNTER — Other Ambulatory Visit (HOSPITAL_COMMUNITY): Payer: Self-pay | Admitting: Obstetrics and Gynecology

## 2014-06-25 ENCOUNTER — Inpatient Hospital Stay (HOSPITAL_COMMUNITY)
Admission: AD | Admit: 2014-06-25 | Discharge: 2014-06-26 | DRG: 778 | Disposition: A | Discharge status: Home / Self Care | Payer: 59 | Source: Ambulatory Visit | Attending: Obstetrics and Gynecology | Admitting: Obstetrics and Gynecology

## 2014-06-25 DIAGNOSIS — O4400 Placenta previa specified as without hemorrhage, unspecified trimester: Secondary | ICD-10-CM

## 2014-06-25 DIAGNOSIS — O4420 Partial placenta previa NOS or without hemorrhage, unspecified trimester: Secondary | ICD-10-CM

## 2014-06-25 DIAGNOSIS — O24410 Gestational diabetes mellitus in pregnancy, diet controlled: Secondary | ICD-10-CM | POA: Diagnosis present

## 2014-06-25 DIAGNOSIS — O4700 False labor before 37 completed weeks of gestation, unspecified trimester: Secondary | ICD-10-CM | POA: Diagnosis present

## 2014-06-25 DIAGNOSIS — O021 Missed abortion: Secondary | ICD-10-CM

## 2014-06-25 DIAGNOSIS — O3433 Maternal care for cervical incompetence, third trimester: Secondary | ICD-10-CM | POA: Diagnosis present

## 2014-06-25 DIAGNOSIS — O26872 Cervical shortening, second trimester: Secondary | ICD-10-CM

## 2014-06-25 DIAGNOSIS — O4413 Placenta previa with hemorrhage, third trimester: Secondary | ICD-10-CM | POA: Diagnosis present

## 2014-06-25 DIAGNOSIS — Z3A33 33 weeks gestation of pregnancy: Secondary | ICD-10-CM | POA: Diagnosis present

## 2014-06-25 MED ORDER — NIFEDIPINE 10 MG PO CAPS
10.0000 mg | ORAL_CAPSULE | Freq: Four times a day (QID) | ORAL | Status: DC
  Administered 2014-06-26: 10 mg via ORAL
  Filled 2014-06-25: qty 1

## 2014-06-25 MED ORDER — TERBUTALINE SULFATE 1 MG/ML IJ SOLN
0.2500 mg | Freq: Once | INTRAMUSCULAR | Status: AC
  Administered 2014-06-25: 0.25 mg via SUBCUTANEOUS
  Filled 2014-06-25: qty 1

## 2014-06-25 MED ORDER — CALCIUM CARBONATE ANTACID 500 MG PO CHEW
2.0000 | CHEWABLE_TABLET | ORAL | Status: DC | PRN
Start: 2014-06-25 — End: 2014-06-26

## 2014-06-25 MED ORDER — PROGESTERONE MICRONIZED 200 MG PO CAPS: 200.0000 mg | ORAL_CAPSULE | Freq: Every day | ORAL | Status: DC

## 2014-06-25 MED ORDER — LACTATED RINGERS IV SOLN
INTRAVENOUS | Status: DC
  Administered 2014-06-26 (×2): via INTRAVENOUS

## 2014-06-25 MED ORDER — NIFEDIPINE 10 MG PO CAPS
20.0000 mg | ORAL_CAPSULE | Freq: Once | ORAL | Status: AC
  Administered 2014-06-26: 20 mg via ORAL
  Filled 2014-06-25: qty 2

## 2014-06-25 MED ORDER — DOCUSATE SODIUM 100 MG PO CAPS: 100.0000 mg | ORAL_CAPSULE | Freq: Every day | ORAL | Status: DC

## 2014-06-25 MED ORDER — ZOLPIDEM TARTRATE 5 MG PO TABS
5.0000 mg | ORAL_TABLET | Freq: Every evening | ORAL | Status: DC | PRN
  Administered 2014-06-26: 5 mg via ORAL
  Filled 2014-06-25: qty 1

## 2014-06-25 MED ORDER — LACTATED RINGERS IV BOLUS (SEPSIS)
1000.0000 mL | Freq: Once | INTRAVENOUS | Status: AC
  Administered 2014-06-25: 1000 mL via INTRAVENOUS

## 2014-06-25 MED ORDER — PRENATAL MULTIVITAMIN CH: 1.0000 | ORAL_TABLET | Freq: Every day | ORAL | Status: DC

## 2014-06-25 MED ORDER — ACETAMINOPHEN 325 MG PO TABS: 650.0000 mg | ORAL_TABLET | ORAL | Status: DC | PRN
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
## 2014-06-25 NOTE — MAU Note (Signed)
Pt reports bleeding one hour ago, decreased fetal movement, back pain.
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
## 2014-06-25 NOTE — MAU Note (Signed)
PT  SAYS SHE WAS ON  ANTENATAL-  WENT  HOME ON  Friday.   ALL OK  FROM Friday  UNTIL   7PM  TONIGHT  THEN SHE STARTED VAG  - SMALL AMT.   ON ARRIVAL-  SMALL AMT IN UNDERWEAR.      [facility]  HAD SOME CRAMPING.      DENIES HSV AND MRSA.

## 2014-06-26 ENCOUNTER — Encounter (HOSPITAL_COMMUNITY): Payer: Self-pay | Admitting: *Deleted

## 2014-06-26 DIAGNOSIS — O24410 Gestational diabetes mellitus in pregnancy, diet controlled: Secondary | ICD-10-CM | POA: Diagnosis present

## 2014-06-26 DIAGNOSIS — O4413 Placenta previa with hemorrhage, third trimester: Secondary | ICD-10-CM | POA: Diagnosis present

## 2014-06-26 DIAGNOSIS — O3433 Maternal care for cervical incompetence, third trimester: Secondary | ICD-10-CM | POA: Diagnosis present

## 2014-06-26 DIAGNOSIS — Z3A33 33 weeks gestation of pregnancy: Secondary | ICD-10-CM | POA: Diagnosis present

## 2014-06-26 LAB — CBC
HCT: 31.2 % — ABNORMAL LOW (ref 36.0–46.0)
Hemoglobin: 10.4 g/dL — ABNORMAL LOW (ref 12.0–15.0)
MCH: 30.8 pg (ref 26.0–34.0)
MCHC: 33.3 g/dL (ref 30.0–36.0)
MCV: 92.3 fL (ref 78.0–100.0)
Platelets: 287 10*3/uL (ref 150–400)
RBC: 3.38 MIL/uL — AB (ref 3.87–5.11)
RDW: 13.4 % (ref 11.5–15.5)
WBC: 10.1 10*3/uL (ref 4.0–10.5)

## 2014-06-26 LAB — TYPE AND SCREEN
ABO/RH(D): A POS
Antibody Screen: NEGATIVE

## 2014-06-26 LAB — GLUCOSE, CAPILLARY: GLUCOSE-CAPILLARY: 92 mg/dL (ref 70–99)

## 2014-06-26 MED ORDER — PROGESTERONE MICRONIZED 200 MG PO CAPS
200.0000 mg | ORAL_CAPSULE | Freq: Every day | ORAL | Status: DC
  Administered 2014-06-26: 200 mg via VAGINAL
  Filled 2014-06-26: qty 1

## 2014-06-26 MED ORDER — PRENATAL MULTIVITAMIN CH: 1.0000 | ORAL_TABLET | Freq: Every day | ORAL | Status: DC

## 2014-06-26 MED ORDER — SERTRALINE HCL 50 MG PO TABS: 50.0000 mg | ORAL_TABLET | Freq: Every day | ORAL | Status: DC

## 2014-06-26 MED ORDER — POLYETHYLENE GLYCOL 3350 17 G PO PACK: 17.0000 g | PACK | Freq: Every day | ORAL | Status: DC

## 2014-06-26 MED ORDER — FLUTICASONE PROPIONATE HFA 44 MCG/ACT IN AERO
2.0000 | INHALATION_SPRAY | Freq: Two times a day (BID) | RESPIRATORY_TRACT | Status: DC
  Filled 2014-06-26: qty 10.6

## 2014-06-26 MED ORDER — NIFEDIPINE 10 MG PO CAPS: 10.0000 mg | ORAL_CAPSULE | Freq: Four times a day (QID) | ORAL | Status: DC | PRN

## 2014-06-26 NOTE — Discharge Instructions (Signed)

## 2014-06-26 NOTE — Progress Notes (Signed)
Patient ID: Mariah BumpsJessica Livingston, female   DOB: 11-Oct-1985, 28 y.o.   MRN: [id number]  33+ with VB, ctx - had vasa previa, resolved; lowlying placenta - resolved; GDM  No c/o's, ctx have slowed, +FM, no LOF, no more VB.  Was small, but BR after vag US - d/w pt  AFVSS gen NAD Abd soft, gravid, NT SVE 1.2/70/-3, soft, cerclage intact.  Cervix soft, but dilation unchanged  FHTs category 1 toco occ  Will d/c to home has f/u for US and office appt 12/14 Counseled re old blood and poss VB after SVE, also given procardia

## 2014-06-26 NOTE — Progress Notes (Signed)
Post discharge ur review completed. 

## 2014-06-26 NOTE — Progress Notes (Signed)
Pt given discharge instructions and verbalizes understanding on PTL s/s and her scheduled F/U appt

## 2014-06-26 NOTE — Discharge Summary (Signed)
Obstetric Discharge Summary Reason for Admission: VB and ctx Prenatal Procedures: none Intrapartum Procedures: N/A Postpartum Procedures: N/A Complications-Operative and Postpartum: N/A HEMOGLOBIN  Date Value Ref Range Status  06/25/2014 10.4* 12.0 - 15.0 g/dL Final   HCT  Date Value Ref Range Status  06/25/2014 31.2* 36.0 - 46.0 % Final    Physical Exam:  General: alert and no distress  Abd soft, gravid FHTs category 1 toco occ  Discharge Diagnoses: Incompetent cervix and s/p cerclage, GDN - vasa previa resolved, marginal previa resolved  Discharge Information: Date: 06/26/2014 Activity: pelvic rest Diet: routine Medications: PNV and procardia Condition: stable Instructions: refer to practice specific booklet Discharge to: home Follow-up Information    Follow up with Sherian ReinBovard-Stuckert, Jaana Brodt, MD.   Specialty:  Obstetrics and Gynecology   Why:  07/01/2014 as scheduled US and MD appt!   Contact information:   [address] [place] [id number] [phone]       [name], Augusto GambleJody 06/26/2014, 8:17 AM

## 2014-06-28 ENCOUNTER — Encounter (HOSPITAL_COMMUNITY): Payer: Self-pay | Admitting: *Deleted

## 2014-07-01 ENCOUNTER — Encounter (HOSPITAL_COMMUNITY): Payer: Self-pay

## 2014-07-01 ENCOUNTER — Ambulatory Visit (HOSPITAL_COMMUNITY)
Admission: RE | Admit: 2014-07-01 | Discharge: 2014-07-01 | Disposition: A | Discharge status: Home / Self Care | Payer: 59 | Source: Ambulatory Visit | Attending: Obstetrics and Gynecology | Admitting: Obstetrics and Gynecology

## 2014-07-01 DIAGNOSIS — O3430 Maternal care for cervical incompetence, unspecified trimester: Secondary | ICD-10-CM

## 2014-07-01 DIAGNOSIS — O26879 Cervical shortening, unspecified trimester: Secondary | ICD-10-CM | POA: Insufficient documentation

## 2014-07-01 DIAGNOSIS — O3433 Maternal care for cervical incompetence, third trimester: Secondary | ICD-10-CM | POA: Insufficient documentation

## 2014-07-01 DIAGNOSIS — O352XX0 Maternal care for (suspected) hereditary disease in fetus, not applicable or unspecified: Secondary | ICD-10-CM | POA: Diagnosis not present

## 2014-07-01 DIAGNOSIS — O09293 Supervision of pregnancy with other poor reproductive or obstetric history, third trimester: Secondary | ICD-10-CM | POA: Diagnosis not present

## 2014-07-01 DIAGNOSIS — O4400 Placenta previa specified as without hemorrhage, unspecified trimester: Secondary | ICD-10-CM

## 2014-07-01 DIAGNOSIS — Z3A33 33 weeks gestation of pregnancy: Secondary | ICD-10-CM | POA: Insufficient documentation

## 2014-07-02 ENCOUNTER — Encounter (HOSPITAL_COMMUNITY): Payer: Self-pay | Admitting: *Deleted

## 2014-07-08 ENCOUNTER — Encounter (HOSPITAL_COMMUNITY): Payer: Self-pay

## 2014-07-08 ENCOUNTER — Inpatient Hospital Stay (HOSPITAL_COMMUNITY)
Admission: AD | Admit: 2014-07-08 | Discharge: 2014-07-09 | Disposition: A | Discharge status: Home / Self Care | Payer: 59 | Source: Ambulatory Visit | Attending: Obstetrics and Gynecology | Admitting: Obstetrics and Gynecology

## 2014-07-08 DIAGNOSIS — F329 Major depressive disorder, single episode, unspecified: Secondary | ICD-10-CM | POA: Insufficient documentation

## 2014-07-08 DIAGNOSIS — R102 Pelvic and perineal pain: Secondary | ICD-10-CM | POA: Insufficient documentation

## 2014-07-08 DIAGNOSIS — J45909 Unspecified asthma, uncomplicated: Secondary | ICD-10-CM | POA: Insufficient documentation

## 2014-07-08 DIAGNOSIS — Z3A35 35 weeks gestation of pregnancy: Secondary | ICD-10-CM | POA: Insufficient documentation

## 2014-07-08 DIAGNOSIS — Z7951 Long term (current) use of inhaled steroids: Secondary | ICD-10-CM | POA: Insufficient documentation

## 2014-07-08 DIAGNOSIS — Z0371 Encounter for suspected problem with amniotic cavity and membrane ruled out: Secondary | ICD-10-CM

## 2014-07-08 DIAGNOSIS — O479 False labor, unspecified: Secondary | ICD-10-CM

## 2014-07-08 DIAGNOSIS — O4703 False labor before 37 completed weeks of gestation, third trimester: Secondary | ICD-10-CM | POA: Insufficient documentation

## 2014-07-08 DIAGNOSIS — O3433 Maternal care for cervical incompetence, third trimester: Secondary | ICD-10-CM | POA: Insufficient documentation

## 2014-07-08 NOTE — MAU Note (Signed)
Pt states that she felt LOF at 2000 tonight and has continued to have a "trickle of fluid" She has a hx of 25 week delivery. She currently has a cerclage and she states it is very uncomfortable and feels like it is possibly tearing/rubbing/stretching. C/o ctx q 11 minutes that are painful. +FM.

## 2014-07-09 ENCOUNTER — Encounter (HOSPITAL_COMMUNITY): Payer: Self-pay | Admitting: *Deleted

## 2014-07-09 DIAGNOSIS — O3433 Maternal care for cervical incompetence, third trimester: Secondary | ICD-10-CM | POA: Diagnosis not present

## 2014-07-09 DIAGNOSIS — Z3A35 35 weeks gestation of pregnancy: Secondary | ICD-10-CM

## 2014-07-09 DIAGNOSIS — Z7951 Long term (current) use of inhaled steroids: Secondary | ICD-10-CM | POA: Diagnosis not present

## 2014-07-09 DIAGNOSIS — O4703 False labor before 37 completed weeks of gestation, third trimester: Secondary | ICD-10-CM

## 2014-07-09 DIAGNOSIS — R102 Pelvic and perineal pain: Secondary | ICD-10-CM | POA: Diagnosis not present

## 2014-07-09 DIAGNOSIS — F329 Major depressive disorder, single episode, unspecified: Secondary | ICD-10-CM | POA: Diagnosis not present

## 2014-07-09 DIAGNOSIS — J45909 Unspecified asthma, uncomplicated: Secondary | ICD-10-CM | POA: Diagnosis not present

## 2014-07-09 LAB — AMNISURE RUPTURE OF MEMBRANE (ROM) NOT AT ARMC: Amnisure ROM: NEGATIVE

## 2014-07-09 LAB — POCT FERN TEST: POCT Fern Test: NEGATIVE

## 2014-07-09 LAB — OB RESULTS CONSOLE GBS: GBS: NEGATIVE

## 2014-07-09 SURGERY — Surgical Case
Anesthesia: Regional

## 2014-07-09 MED ORDER — LACTATED RINGERS IV BOLUS (SEPSIS)
1000.0000 mL | Freq: Once | INTRAVENOUS | Status: AC
  Administered 2014-07-09: 1000 mL via INTRAVENOUS

## 2014-07-09 NOTE — Discharge Instructions (Signed)
Braxton Hicks Contractions °Contractions of the uterus can occur throughout pregnancy. Contractions are not always a sign that you are in labor.  °WHAT ARE BRAXTON HICKS CONTRACTIONS?  °Contractions that occur before labor are called Braxton Hicks contractions, or false labor. Toward the end of pregnancy (32-34 weeks), these contractions can develop more often and may become more forceful. This is not true labor because these contractions do not result in opening (dilatation) and thinning of the cervix. They are sometimes difficult to tell apart from true labor because these contractions can be forceful and people have different pain tolerances. You should not feel embarrassed if you go to the hospital with false labor. Sometimes, the only way to tell if you are in true labor is for your health care provider to look for changes in the cervix. °If there are no prenatal problems or other health problems associated with the pregnancy, it is completely safe to be sent home with false labor and await the onset of true labor. °HOW CAN YOU TELL THE DIFFERENCE BETWEEN TRUE AND FALSE LABOR? °False Labor °· The contractions of false labor are usually shorter and not as hard as those of true labor.   °· The contractions are usually irregular.   °· The contractions are often felt in the front of the lower abdomen and in the groin.   °· The contractions may go away when you walk around or change positions while lying down.   °· The contractions get weaker and are shorter lasting as time goes on.   °· The contractions do not usually become progressively stronger, regular, and closer together as with true labor.   °True Labor °· Contractions in true labor last 30-70 seconds, become very regular, usually become more intense, and increase in frequency.   °· The contractions do not go away with walking.   °· The discomfort is usually felt in the top of the uterus and spreads to the lower abdomen and low back.   °· True labor can be  determined by your health care provider with an exam. This will show that the cervix is dilating and getting thinner.   °WHAT TO REMEMBER °· Keep up with your usual exercises and follow other instructions given by your health care provider.   °· Take medicines as directed by your health care provider.   °· Keep your regular prenatal appointments.   °· Eat and drink lightly if you think you are going into labor.   °· If Braxton Hicks contractions are making you uncomfortable:   °¨ Change your position from lying down or resting to walking, or from walking to resting.   °¨ Sit and rest in a tub of warm water.   °¨ Drink 2-3 glasses of water. Dehydration may cause these contractions.   °¨ Do slow and deep breathing several times an hour.   °WHEN SHOULD I SEEK IMMEDIATE MEDICAL CARE? °Seek immediate medical care if: °· Your contractions become stronger, more regular, and closer together.   °· You have fluid leaking or gushing from your vagina.   °· You have a fever.   °· You pass blood-tinged mucus.   °· You have vaginal bleeding.   °· You have continuous abdominal pain.   °· You have low back pain that you never had before.   °· You feel your baby's head pushing down and causing pelvic pressure.   °· Your baby is not moving as much as it used to.   °Document Released: 07/05/2005 Document Revised: 07/10/2013 Document Reviewed: 04/16/2013 °[employer]® Patient Information ©2015 [employer]. This information is not intended to replace advice given to you by your health care   provider. Make sure you discuss any questions you have with your health care provider. ° °

## 2014-07-09 NOTE — MAU Note (Signed)
An After Visit Summary was printed and given to the patient. Pt verbalized understanding of DC orders. Hardie PulleyKim Roxy Filler, RNC
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
## 2014-07-19 NOTE — L&D Delivery Note (Signed)
Delivery Note At 10:20 PM a viable and healthy female was delivered via Vaginal, Spontaneous Delivery (Presentation: Left Occiput Anterior).  APGAR: 9, 9; weight P .   Placenta status: Intact, Spontaneous.  Cord: 3 VC with the following complications: loose nuchal.    Anesthesia: Epidural  Episiotomy: None Lacerations: 1st degree;Perineal;Vaginal, L labial Suture Repair: 3.0 vicryl rapide Est. Blood Loss (mL): 400cc  Mom to postpartum.  Baby to Couplet care / Skin to Skin.  [name], Jonisha Kindig 07/27/2014, 10:50 PM  D/W pt and FOB circumcision for female infant, including r/b/a - wish to proceed  Br/A+/RI/Tdap in PNC/Mirena IUD

## 2014-07-23 ENCOUNTER — Encounter (HOSPITAL_COMMUNITY): Payer: Self-pay | Admitting: General Practice

## 2014-07-23 ENCOUNTER — Inpatient Hospital Stay (HOSPITAL_COMMUNITY)
Admission: AD | Admit: 2014-07-23 | Discharge: 2014-07-24 | Disposition: A | Discharge status: Home / Self Care | Payer: 59 | Source: Ambulatory Visit | Attending: Obstetrics and Gynecology | Admitting: Obstetrics and Gynecology

## 2014-07-23 DIAGNOSIS — Z5320 Procedure and treatment not carried out because of patient's decision for unspecified reasons: Secondary | ICD-10-CM | POA: Insufficient documentation

## 2014-07-23 NOTE — MAU Note (Signed)
Pt presents to MAU for routine labor check  

## 2014-07-23 NOTE — MAU Note (Signed)
NOT IN LOBBY 

## 2014-07-24 DIAGNOSIS — O471 False labor at or after 37 completed weeks of gestation: Secondary | ICD-10-CM | POA: Diagnosis present

## 2014-07-24 DIAGNOSIS — Z5320 Procedure and treatment not carried out because of patient's decision for unspecified reasons: Secondary | ICD-10-CM | POA: Diagnosis not present

## 2014-07-24 LAB — POCT FERN TEST: POCT Fern Test: NEGATIVE

## 2014-07-24 LAB — AMNISURE RUPTURE OF MEMBRANE (ROM) NOT AT ARMC: Amnisure ROM: NEGATIVE

## 2014-07-24 MED ORDER — ZOLPIDEM TARTRATE 5 MG PO TABS
5.0000 mg | ORAL_TABLET | Freq: Once | ORAL | Status: AC
  Administered 2014-07-24: 5 mg via ORAL
  Filled 2014-07-24: qty 1

## 2014-07-24 NOTE — Discharge Instructions (Signed)
KEEP all APPOINTMENTS  Braxton Hicks Contractions Contractions of the uterus can occur throughout pregnancy. Contractions are not always a sign that you are in labor.  WHAT ARE BRAXTON HICKS CONTRACTIONS?  Contractions that occur before labor are called Braxton Hicks contractions, or false labor. Toward the end of pregnancy (32-34 weeks), these contractions can develop more often and may become more forceful. This is not true labor because these contractions do not result in opening (dilatation) and thinning of the cervix. They are sometimes difficult to tell apart from true labor because these contractions can be forceful and people have different pain tolerances. You should not feel embarrassed if you go to the hospital with false labor. Sometimes, the only way to tell if you are in true labor is for your health care provider to look for changes in the cervix. If there are no prenatal problems or other health problems associated with the pregnancy, it is completely safe to be sent home with false labor and await the onset of true labor. HOW CAN YOU TELL THE DIFFERENCE BETWEEN TRUE AND FALSE LABOR? False Labor  The contractions of false labor are usually shorter and not as hard as those of true labor.   The contractions are usually irregular.   The contractions are often felt in the front of the lower abdomen and in the groin.   The contractions may go away when you walk around or change positions while lying down.   The contractions get weaker and are shorter lasting as time goes on.   The contractions do not usually become progressively stronger, regular, and closer together as with true labor.  True Labor  Contractions in true labor last 30-70 seconds, become very regular, usually become more intense, and increase in frequency.   The contractions do not go away with walking.   The discomfort is usually felt in the top of the uterus and spreads to the lower abdomen and low back.    True labor can be determined by your health care provider with an exam. This will show that the cervix is dilating and getting thinner.  WHAT TO REMEMBER  Keep up with your usual exercises and follow other instructions given by your health care provider.   Take medicines as directed by your health care provider.   Keep your regular prenatal appointments.   Eat and drink lightly if you think you are going into labor.   If Braxton Hicks contractions are making you uncomfortable:   Change your position from lying down or resting to walking, or from walking to resting.   Sit and rest in a tub of warm water.   Drink 2-3 glasses of water. Dehydration may cause these contractions.   Do slow and deep breathing several times an hour.  WHEN SHOULD I SEEK IMMEDIATE MEDICAL CARE? Seek immediate medical care if:  Your contractions become stronger, more regular, and closer together.   You have fluid leaking or gushing from your vagina.   You have a fever.   You pass blood-tinged mucus.   You have vaginal bleeding.   You have continuous abdominal pain.   You have low back pain that you never had before.   You feel your baby's head pushing down and causing pelvic pressure.   Your baby is not moving as much as it used to.  Document Released: 07/05/2005 Document Revised: 07/10/2013 Document Reviewed: 04/16/2013 [facility] Patient Information 2015 [place], [place]. This information is not intended to replace advice given to you  by your health care provider. Make sure you discuss any questions you have with your health care provider. Fetal Movement Counts Patient Name: __________________________________________________ Patient Due Date: ____________________ Performing a fetal movement count is highly recommended in high-risk pregnancies, but it is good for every pregnant woman to do. Your health care provider may ask you to start counting fetal movements at 28 weeks of  the pregnancy. Fetal movements often increase:  After eating a full meal.  After physical activity.  After eating or drinking something sweet or cold.  At rest. Pay attention to when you feel the baby is most active. This will help you notice a pattern of your baby's sleep and wake cycles and what factors contribute to an increase in fetal movement. It is important to perform a fetal movement count at the same time each Drum when your baby is normally most active.  HOW TO COUNT FETAL MOVEMENTS  Find a quiet and comfortable area to sit or lie down on your left side. Lying on your left side provides the best blood and oxygen circulation to your baby.  Write down the [place] and time on a sheet of paper or in a journal.  Start counting kicks, flutters, swishes, rolls, or jabs in a 2-hour period. You should feel at least 10 movements within 2 hours.  If you do not feel 10 movements in 2 hours, wait 2-3 hours and count again. Look for a change in the pattern or not enough counts in 2 hours. SEEK MEDICAL CARE IF:  You feel less than 10 counts in 2 hours, tried twice.  There is no movement in over an hour.  The pattern is changing or taking longer each [place] to reach 10 counts in 2 hours.  You feel the baby is not moving as he or she usually does. Date: ____________ Movements: ____________ Start time: ____________ [name] time: ____________  Date: ____________ Movements: ____________ Start time: ____________ [name] time: ____________ Date: ____________ Movements: ____________ Start time: ____________ [name] time: ____________ Date: ____________ Movements: ____________ Start time: ____________ [name] time: ____________ Date: ____________ Movements: ____________ Start time: ____________ [name] time: ____________ Date: ____________ Movements: ____________ Start time: ____________ [name] time: ____________ Date: ____________ Movements: ____________ Start time: ____________ [name] time:  ____________ Date: ____________ Movements: ____________ Start time: ____________ [name] time: ____________  Date: ____________ Movements: ____________ Start time: ____________ [name] time: ____________ Date: ____________ Movements: ____________ Start time: ____________ [name] time: ____________ Date: ____________ Movements: ____________ Start time: ____________ [name] time: ____________ Date: ____________ Movements: ____________ Start time: ____________ [name] time: ____________ Date: ____________ Movements: ____________ Start time: ____________ [name] time: ____________ Date: ____________ Movements: ____________ Start time: ____________ [name] time: ____________ Date: ____________ Movements: ____________ Start time: ____________ [name] time: ____________  Date: ____________ Movements: ____________ Start time: ____________ [name] time: ____________ Date: ____________ Movements: ____________ Start time: ____________ [name] time: ____________ Date: ____________ Movements: ____________ Start time: ____________ [name] time: ____________ Date: ____________ Movements: ____________ Start time: ____________ [name] time: ____________ Date: ____________ Movements: ____________ Start time: ____________ [name] time: ____________ Date: ____________ Movements: ____________ Start time: ____________ [name] time: ____________ Date: ____________ Movements: ____________ Start time: ____________ [name] time: ____________  Date: ____________ Movements: ____________ Start time: ____________ [name] time: ____________ Date: ____________ Movements: ____________ Start time: ____________ [name] time: ____________ Date: ____________ Movements: ____________ Start time: ____________ [name] time: ____________ Date: ____________ Movements: ____________ Start time: ____________ [name] time: ____________ Date: ____________ Movements: ____________ Start time: ____________ [name] time: ____________ Date: ____________ Movements:  ____________ Start time: ____________ [name] time: ____________  Date: ____________ Movements: ____________ Start time: ____________ Mariah MartinFinish time: ____________  Date: ____________ Movements: ____________ Start time: ____________ Mariah MartinFinish time: ____________ Date: ____________ Movements: ____________ Start time: ____________ Mariah MartinFinish time: ____________ Date: ____________ Movements: ____________ Start time: ____________ Mariah MartinFinish time: ____________ Date: ____________ Movements: ____________ Start time: ____________ Mariah MartinFinish time: ____________ Date: ____________ Movements: ____________ Start time: ____________ Mariah MartinFinish time: ____________ Date: ____________ Movements: ____________ Start time: ____________ Mariah MartinFinish time: ____________ Date: ____________ Movements: ____________ Start time: ____________ Mariah MartinFinish time: ____________  Date: ____________ Movements: ____________ Start time: ____________ Mariah MartinFinish time: ____________ Date: ____________ Movements: ____________ Start time: ____________ Mariah MartinFinish time: ____________ Date: ____________ Movements: ____________ Start time: ____________ Mariah MartinFinish time: ____________ Date: ____________ Movements: ____________ Start time: ____________ Mariah MartinFinish time: ____________ Date: ____________ Movements: ____________ Start time: ____________ Mariah MartinFinish time: ____________ Date: ____________ Movements: ____________ Start time: ____________ Mariah MartinFinish time: ____________ Date: ____________ Movements: ____________ Start time: ____________ Mariah MartinFinish time: ____________  Date: ____________ Movements: ____________ Start time: ____________ Mariah MartinFinish time: ____________ Date: ____________ Movements: ____________ Start time: ____________ Mariah MartinFinish time: ____________ Date: ____________ Movements: ____________ Start time: ____________ Mariah MartinFinish time: ____________ Date: ____________ Movements: ____________ Start time: ____________ Mariah MartinFinish time: ____________ Date: ____________ Movements: ____________ Start time: ____________ Mariah MartinFinish  time: ____________ Date: ____________ Movements: ____________ Start time: ____________ Mariah MartinFinish time: ____________ Date: ____________ Movements: ____________ Start time: ____________ Mariah MartinFinish time: ____________  Date: ____________ Movements: ____________ Start time: ____________ Mariah MartinFinish time: ____________ Date: ____________ Movements: ____________ Start time: ____________ Mariah MartinFinish time: ____________ Date: ____________ Movements: ____________ Start time: ____________ Mariah MartinFinish time: ____________ Date: ____________ Movements: ____________ Start time: ____________ Mariah MartinFinish time: ____________ Date: ____________ Movements: ____________ Start time: ____________ Mariah MartinFinish time: ____________ Date: ____________ Movements: ____________ Start time: ____________ Mariah MartinFinish time: ____________ Document Released: 08/04/2006 Document Revised: 11/19/2013 Document Reviewed: 05/01/2012 [employer] Patient Information 2015 [employer]. This information is not intended to replace advice given to you by your health care provider. Make sure you discuss any questions you have with your health care provider.

## 2014-07-27 ENCOUNTER — Encounter (HOSPITAL_COMMUNITY): Payer: Self-pay | Admitting: *Deleted

## 2014-07-27 ENCOUNTER — Inpatient Hospital Stay (HOSPITAL_COMMUNITY)
Admission: AD | Admit: 2014-07-27 | Discharge: 2014-07-29 | DRG: 774 | Disposition: A | Discharge status: Home / Self Care | Payer: 59 | Source: Ambulatory Visit | Attending: Obstetrics and Gynecology | Admitting: Obstetrics and Gynecology

## 2014-07-27 ENCOUNTER — Inpatient Hospital Stay (HOSPITAL_COMMUNITY): Payer: 59 | Admitting: Anesthesiology

## 2014-07-27 DIAGNOSIS — O24420 Gestational diabetes mellitus in childbirth, diet controlled: Principal | ICD-10-CM | POA: Diagnosis present

## 2014-07-27 DIAGNOSIS — O4413 Placenta previa with hemorrhage, third trimester: Secondary | ICD-10-CM | POA: Diagnosis present

## 2014-07-27 DIAGNOSIS — O4290 Premature rupture of membranes, unspecified as to length of time between rupture and onset of labor, unspecified weeks of gestation: Secondary | ICD-10-CM

## 2014-07-27 DIAGNOSIS — Z3483 Encounter for supervision of other normal pregnancy, third trimester: Secondary | ICD-10-CM | POA: Diagnosis present

## 2014-07-27 DIAGNOSIS — Z3A37 37 weeks gestation of pregnancy: Secondary | ICD-10-CM | POA: Diagnosis present

## 2014-07-27 LAB — CBC
HCT: 38.2 % (ref 36.0–46.0)
Hemoglobin: 12.7 g/dL (ref 12.0–15.0)
MCH: 30.8 pg (ref 26.0–34.0)
MCHC: 33.2 g/dL (ref 30.0–36.0)
MCV: 92.5 fL (ref 78.0–100.0)
PLATELETS: 229 10*3/uL (ref 150–400)
RBC: 4.13 MIL/uL (ref 3.87–5.11)
RDW: 14.4 % (ref 11.5–15.5)
WBC: 14.1 10*3/uL — AB (ref 4.0–10.5)

## 2014-07-27 LAB — GLUCOSE, CAPILLARY
Glucose-Capillary: 71 mg/dL (ref 70–99)
Glucose-Capillary: 116 mg/dL — ABNORMAL HIGH (ref 70–99)
Glucose-Capillary: 83 mg/dL (ref 70–99)

## 2014-07-27 LAB — TYPE AND SCREEN
ABO/RH(D): A POS
ANTIBODY SCREEN: NEGATIVE

## 2014-07-27 MED ORDER — PHENYLEPHRINE 40 MCG/ML (10ML) SYRINGE FOR IV PUSH (FOR BLOOD PRESSURE SUPPORT)
80.0000 ug | PREFILLED_SYRINGE | INTRAVENOUS | Status: DC | PRN
  Filled 2014-07-27: qty 2
  Filled 2014-07-27: qty 20

## 2014-07-27 MED ORDER — OXYTOCIN 40 UNITS IN LACTATED RINGERS INFUSION - SIMPLE MED
1.0000 m[IU]/min | INTRAVENOUS | Status: DC
  Administered 2014-07-27: 2 m[IU]/min via INTRAVENOUS
  Filled 2014-07-27: qty 1000

## 2014-07-27 MED ORDER — OXYCODONE-ACETAMINOPHEN 5-325 MG PO TABS: 2.0000 | ORAL_TABLET | ORAL | Status: DC | PRN

## 2014-07-27 MED ORDER — PHENYLEPHRINE 40 MCG/ML (10ML) SYRINGE FOR IV PUSH (FOR BLOOD PRESSURE SUPPORT)
80.0000 ug | PREFILLED_SYRINGE | INTRAVENOUS | Status: DC | PRN
  Filled 2014-07-27: qty 2

## 2014-07-27 MED ORDER — LACTATED RINGERS IV SOLN: 500.0000 mL | INTRAVENOUS | Status: DC | PRN

## 2014-07-27 MED ORDER — LACTATED RINGERS IV SOLN: 500.0000 mL | Freq: Once | INTRAVENOUS | Status: DC

## 2014-07-27 MED ORDER — FENTANYL 2.5 MCG/ML BUPIVACAINE 1/10 % EPIDURAL INFUSION (WH - ANES)
INTRAMUSCULAR | Status: DC | PRN
  Administered 2014-07-27: 14 mL/h via EPIDURAL

## 2014-07-27 MED ORDER — CITRIC ACID-SODIUM CITRATE 334-500 MG/5ML PO SOLN: 30.0000 mL | ORAL | Status: DC | PRN

## 2014-07-27 MED ORDER — EPHEDRINE 5 MG/ML INJ
10.0000 mg | INTRAVENOUS | Status: DC | PRN
  Filled 2014-07-27: qty 2

## 2014-07-27 MED ORDER — LIDOCAINE HCL (PF) 1 % IJ SOLN
30.0000 mL | INTRAMUSCULAR | Status: DC | PRN
  Administered 2014-07-27: 30 mL via SUBCUTANEOUS
  Filled 2014-07-27: qty 30

## 2014-07-27 MED ORDER — FENTANYL 2.5 MCG/ML BUPIVACAINE 1/10 % EPIDURAL INFUSION (WH - ANES)
14.0000 mL/h | INTRAMUSCULAR | Status: DC | PRN
  Administered 2014-07-27: 14 mL/h via EPIDURAL
  Filled 2014-07-27 (×2): qty 125

## 2014-07-27 MED ORDER — LIDOCAINE HCL (PF) 1 % IJ SOLN
INTRAMUSCULAR | Status: DC | PRN
  Administered 2014-07-27 (×2): 4 mL

## 2014-07-27 MED ORDER — OXYTOCIN BOLUS FROM INFUSION
500.0000 mL | INTRAVENOUS | Status: DC
  Administered 2014-07-27: 500 mL via INTRAVENOUS

## 2014-07-27 MED ORDER — TERBUTALINE SULFATE 1 MG/ML IJ SOLN: 0.2500 mg | Freq: Once | INTRAMUSCULAR | Status: AC | PRN

## 2014-07-27 MED ORDER — BUPIVACAINE HCL (PF) 0.25 % IJ SOLN
INTRAMUSCULAR | Status: DC | PRN
  Administered 2014-07-27: 5 mL

## 2014-07-27 MED ORDER — ONDANSETRON HCL 4 MG/2ML IJ SOLN: 4.0000 mg | Freq: Four times a day (QID) | INTRAMUSCULAR | Status: DC | PRN

## 2014-07-27 MED ORDER — LACTATED RINGERS IV SOLN
INTRAVENOUS | Status: DC
Start: 2014-07-27 — End: 2014-07-28
  Administered 2014-07-27: 17:00:00 via INTRAVENOUS

## 2014-07-27 MED ORDER — DIPHENHYDRAMINE HCL 50 MG/ML IJ SOLN: 12.5000 mg | INTRAMUSCULAR | Status: DC | PRN

## 2014-07-27 MED ORDER — OXYCODONE-ACETAMINOPHEN 5-325 MG PO TABS: 1.0000 | ORAL_TABLET | ORAL | Status: DC | PRN

## 2014-07-27 MED ORDER — BUTORPHANOL TARTRATE 1 MG/ML IJ SOLN: 1.0000 mg | INTRAMUSCULAR | Status: DC | PRN

## 2014-07-27 MED ORDER — ACETAMINOPHEN 325 MG PO TABS
650.0000 mg | ORAL_TABLET | ORAL | Status: DC | PRN
  Administered 2014-07-27: 650 mg via ORAL
  Filled 2014-07-27: qty 2

## 2014-07-27 MED ORDER — FLEET ENEMA 7-19 GM/118ML RE ENEM: 1.0000 | ENEMA | RECTAL | Status: DC | PRN

## 2014-07-27 MED ORDER — OXYTOCIN 40 UNITS IN LACTATED RINGERS INFUSION - SIMPLE MED: 62.5000 mL/h | INTRAVENOUS | Status: DC

## 2014-07-27 NOTE — Progress Notes (Signed)
Patient ID: [name], female   DOB: [birth year], 29 y.o.   MRN: [id number]  Getting comfortable with epidural  AFVSS gen NAD, uncomfortable  FHT 140's, good var, category 1 toco q 2min  SVE 5.6/90/0  IUPC placed w/o diff/comp  28yo G5P0131 at 37+ w/ ROM and labor Expect SVD

## 2014-07-27 NOTE — Progress Notes (Signed)
Spoke with timeka in lab at 1647 - notified of the initial b that was dropped from name - medical record number remained the same - pt did not have to be rebanded or restuck

## 2014-07-27 NOTE — MAU Note (Signed)
ROM at 1300 meconium stained fluid, SVE 5, 90%, -1.  IV started, labs drawn, sent to labor and delivery, Dr. Ellyn HackBovard notified and orders put in.

## 2014-07-27 NOTE — Progress Notes (Signed)
Patient ID: [name], female   DOB: [birth year], 29 y.o.   MRN: [id number]  Comfortable with ctx, some pressure  AFVSS ( Tm= 100.1) gen NAD FHTs 150's, good var, category 1 toco Q 3min mVu - adequate  SVE 9.5/100/0-+1  D/w pt laboring down and likely pushing soon Expect SVD
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
## 2014-07-27 NOTE — Progress Notes (Signed)
Dr Gentry Rochjudd at bedside

## 2014-07-27 NOTE — Anesthesia Procedure Notes (Signed)
Epidural Patient location during procedure: OB Start time: 07/27/2014 3:20 PM End time: 07/27/2014 3:27 PM  Staffing Anesthesiologist: Felipe DroneJUDD, Demitrious Mccannon JENNETTE Performed by: anesthesiologist   Preanesthetic Checklist Completed: patient identified, site marked, surgical consent, pre-op evaluation, timeout performed, IV checked, risks and benefits discussed and monitors and equipment checked  Epidural Patient position: sitting Prep: site prepped and draped and DuraPrep Patient monitoring: continuous pulse ox and blood pressure Approach: midline Location: L3-L4 Injection technique: LOR saline  Needle:  Needle type: Tuohy  Needle gauge: 17 G Needle length: 9 cm and 9 Needle insertion depth: 5 cm cm Catheter type: closed end flexible Catheter size: 19 Gauge Catheter at skin depth: 9 cm Test dose: negative  Assessment Events: blood not aspirated, injection not painful, no injection resistance, negative IV test and no paresthesia  Additional Notes Patient identified. Risks/Benefits/Options discussed with patient including but not limited to bleeding, infection, nerve damage, paralysis, failed block, incomplete pain control, headache, blood pressure changes, nausea, vomiting, reactions to medication both or allergic, itching and postpartum back pain. Confirmed with bedside nurse the patient's most recent platelet count. Confirmed with patient that they are not currently taking any anticoagulation, have any bleeding history or any family history of bleeding disorders. Patient expressed understanding and wished to proceed. All questions were answered. Sterile technique was used throughout the entire procedure. Please see nursing notes for vital signs. Test dose was given through epidural catheter and negative prior to continuing to dose epidural or start infusion. Warning signs of high block given to the patient including shortness of breath, tingling/numbness in hands, complete motor block, or any  concerning symptoms with instructions to call for help. Patient was given instructions on fall risk and not to get out of bed. All questions and concerns addressed with instructions to call with any issues or inadequate analgesia.
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
## 2014-07-28 ENCOUNTER — Encounter (HOSPITAL_COMMUNITY): Payer: Self-pay | Admitting: *Deleted

## 2014-07-28 LAB — CBC
HCT: 30.8 % — ABNORMAL LOW (ref 36.0–46.0)
HEMOGLOBIN: 10.3 g/dL — AB (ref 12.0–15.0)
MCH: 30.8 pg (ref 26.0–34.0)
MCHC: 33.4 g/dL (ref 30.0–36.0)
MCV: 92.2 fL (ref 78.0–100.0)
Platelets: 208 10*3/uL (ref 150–400)
RBC: 3.34 MIL/uL — AB (ref 3.87–5.11)
RDW: 14.7 % (ref 11.5–15.5)
WBC: 20 10*3/uL — AB (ref 4.0–10.5)

## 2014-07-28 LAB — RPR: RPR Ser Ql: NONREACTIVE

## 2014-07-28 MED ORDER — IBUPROFEN 600 MG PO TABS
600.0000 mg | ORAL_TABLET | Freq: Four times a day (QID) | ORAL | Status: DC
  Administered 2014-07-28 – 2014-07-29 (×5): 600 mg via ORAL
  Filled 2014-07-28 (×5): qty 1

## 2014-07-28 MED ORDER — OXYCODONE-ACETAMINOPHEN 5-325 MG PO TABS
2.0000 | ORAL_TABLET | ORAL | Status: DC | PRN
  Administered 2014-07-28 – 2014-07-29 (×3): 2 via ORAL
  Filled 2014-07-28 (×3): qty 2

## 2014-07-28 MED ORDER — ONDANSETRON HCL 4 MG PO TABS: 4.0000 mg | ORAL_TABLET | ORAL | Status: DC | PRN

## 2014-07-28 MED ORDER — SENNOSIDES-DOCUSATE SODIUM 8.6-50 MG PO TABS
2.0000 | ORAL_TABLET | ORAL | Status: DC
  Administered 2014-07-28: 2 via ORAL
  Filled 2014-07-28: qty 2

## 2014-07-28 MED ORDER — PRENATAL MULTIVITAMIN CH
1.0000 | ORAL_TABLET | Freq: Every day | ORAL | Status: DC
  Administered 2014-07-28 – 2014-07-29 (×2): 1 via ORAL
  Filled 2014-07-28 (×2): qty 1

## 2014-07-28 MED ORDER — SERTRALINE HCL 50 MG PO TABS
50.0000 mg | ORAL_TABLET | Freq: Every day | ORAL | Status: DC
  Administered 2014-07-28 – 2014-07-29 (×2): 50 mg via ORAL
  Filled 2014-07-28 (×3): qty 1

## 2014-07-28 MED ORDER — DIBUCAINE 1 % RE OINT: 1.0000 "application " | TOPICAL_OINTMENT | RECTAL | Status: DC | PRN

## 2014-07-28 MED ORDER — SIMETHICONE 80 MG PO CHEW: 80.0000 mg | CHEWABLE_TABLET | ORAL | Status: DC | PRN

## 2014-07-28 MED ORDER — WITCH HAZEL-GLYCERIN EX PADS: 1.0000 "application " | MEDICATED_PAD | CUTANEOUS | Status: DC | PRN

## 2014-07-28 MED ORDER — OXYCODONE-ACETAMINOPHEN 5-325 MG PO TABS
1.0000 | ORAL_TABLET | ORAL | Status: DC | PRN
  Administered 2014-07-28 (×2): 1 via ORAL
  Filled 2014-07-28 (×2): qty 1

## 2014-07-28 MED ORDER — OXYCODONE HCL 5 MG PO TABS
5.0000 mg | ORAL_TABLET | ORAL | Status: DC | PRN
Start: 2014-07-28 — End: 2014-07-29
  Administered 2014-07-28 – 2014-07-29 (×4): 5 mg via ORAL
  Filled 2014-07-28 (×4): qty 1

## 2014-07-28 MED ORDER — CALCIUM CARBONATE ANTACID 500 MG PO CHEW: 2.0000 | CHEWABLE_TABLET | Freq: Two times a day (BID) | ORAL | Status: DC | PRN

## 2014-07-28 MED ORDER — BENZOCAINE-MENTHOL 20-0.5 % EX AERO
1.0000 "application " | INHALATION_SPRAY | CUTANEOUS | Status: DC | PRN
  Administered 2014-07-28: 1 via TOPICAL
  Filled 2014-07-28: qty 56

## 2014-07-28 MED ORDER — ZOLPIDEM TARTRATE 5 MG PO TABS: 5.0000 mg | ORAL_TABLET | Freq: Every evening | ORAL | Status: DC | PRN

## 2014-07-28 MED ORDER — DIPHENHYDRAMINE HCL 25 MG PO CAPS: 25.0000 mg | ORAL_CAPSULE | Freq: Four times a day (QID) | ORAL | Status: DC | PRN

## 2014-07-28 MED ORDER — PNEUMOCOCCAL VAC POLYVALENT 25 MCG/0.5ML IJ INJ
0.5000 mL | INJECTION | INTRAMUSCULAR | Status: AC
  Administered 2014-07-29: 0.5 mL via INTRAMUSCULAR
  Filled 2014-07-28: qty 0.5

## 2014-07-28 MED ORDER — LACTATED RINGERS IV SOLN: INTRAVENOUS | Status: DC

## 2014-07-28 MED ORDER — SERTRALINE HCL 50 MG PO TABS: 50.0000 mg | ORAL_TABLET | Freq: Every day | ORAL | Status: DC

## 2014-07-28 MED ORDER — ONDANSETRON HCL 4 MG/2ML IJ SOLN: 4.0000 mg | INTRAMUSCULAR | Status: DC | PRN

## 2014-07-28 MED ORDER — LANOLIN HYDROUS EX OINT: TOPICAL_OINTMENT | CUTANEOUS | Status: DC | PRN

## 2014-07-28 NOTE — Lactation Note (Signed)
This note was copied from the chart of Boy Amanee Urton. Lactation Consultation Note  Patient Name: Boy Shanda BumpsJessica Kattner Today's Date: 07/28/2014 Reason for consult: Initial assessment   Initial consult at 3019 hours old.  Infant has had breastfeeding attempts x1 (no minutes recorded) with LS-4 + formula bottles x5 (8-20 ml) since birth 19 hours ago; voids-2; stools-5 since birth Mom was using personal DEBP only on right side and had ~3-5 ml collected in bottom of container.  Discussed size of infant's stomach per [facility] of life. Mom states she does not know if she wants to latch him and stated she has FN and IN.  Nipples were erect after pumping, when LC suggested offering to help with latching, mom declined. LC told mom to let RN know if she would like help with latching or has further questions regarding breastfeeding or breast milk feeding (via bottle).  Encouraged using paced bottlefeeding method with feedings and taught how to use paced method. Lactation brochure given and informed of outpatient services and hospital support group.   Follow-up with LC, PRN (at mom's request).   Maternal Data    Feeding Feeding Type: Bottle Fed - Formula  LATCH Score/Interventions                      Lactation Tools Discussed/Used Tools: Pump Breast pump type: Double-Electric Breast Pump   Consult Status Consult Status: PRN    Lendon KaVann, Jaysun Wessels Walker 07/28/2014, 5:47 PM

## 2014-07-28 NOTE — Anesthesia Postprocedure Evaluation (Signed)
  Anesthesia Post-op Note  Anesthesia Post Note  Patient: [name]  Procedure(s) Performed: * No procedures listed *  Anesthesia type: Epidural  Patient location: Mother/Baby  Post pain: Pain level controlled  Post assessment: Post-op Vital signs reviewed  Last Vitals:  Filed Vitals:   07/28/14 0543  BP: 117/73  Pulse: 85  Temp: 36.7 C  Resp: 18    Post vital signs: Reviewed  Level of consciousness:alert  Complications: No apparent anesthesia complications

## 2014-07-28 NOTE — Progress Notes (Addendum)
Post Partum [name] 1 Subjective: no complaints, up ad lib, voiding, tolerating PO and nl lochia, pain controlled - some increased cramping.    Objective: Blood pressure 117/73, pulse 85, temperature 98 F (36.7 C), temperature source Oral, resp. rate 18, height 5\' 2"  (1.575 m), weight 79.833 kg (176 lb), last menstrual period 10/28/2013, SpO2 100 %, unknown if currently breastfeeding.  Physical Exam:  General: alert and no distress Lochia: appropriate Uterine Fundus: firm   Recent Labs  07/27/14 1420 07/28/14 0600  HGB 12.7 10.3*  HCT 38.2 30.8*    Assessment/Plan: Plan for discharge tomorrow, Breastfeeding and Lactation consult.  Routine PP care.  Baby doing well. Will add oxycodone for breakthrough pain.      LOS: 1 [name]   [name], Matson Welch 07/28/2014, 9:09 AM

## 2014-07-29 MED ORDER — OXYCODONE HCL 5 MG PO TABS: 5.0000 mg | ORAL_TABLET | ORAL | Status: AC | PRN

## 2014-07-29 MED ORDER — PRENATAL MULTIVITAMIN CH: 1.0000 | ORAL_TABLET | Freq: Every day | ORAL | Status: AC

## 2014-07-29 MED ORDER — OXYCODONE-ACETAMINOPHEN 5-325 MG PO TABS: 1.0000 | ORAL_TABLET | Freq: Four times a day (QID) | ORAL | Status: AC | PRN

## 2014-07-29 MED ORDER — IBUPROFEN 800 MG PO TABS: 800.0000 mg | ORAL_TABLET | Freq: Three times a day (TID) | ORAL | Status: AC | PRN

## 2014-07-29 NOTE — Progress Notes (Signed)
Post Partum Graef 2 Subjective: no complaints, up ad lib, voiding, tolerating PO and nl lochia, continued cramping - pain controlled  Objective: Blood pressure 105/73, pulse 71, temperature 97.5 F (36.4 C), temperature source Oral, resp. rate 18, height 5\' 2"  (1.575 m), weight 79.833 kg (176 lb), last menstrual period 10/28/2013, SpO2 100 %, unknown if currently breastfeeding.  Physical Exam:  General: alert and no distress Lochia: appropriate Uterine Fundus: firm   Recent Labs  07/27/14 1420 07/28/14 0600  HGB 12.7 10.3*  HCT 38.2 30.8*    Assessment/Plan: Discharge home, Breastfeeding and Lactation consult.  Routine PP care.  D/C home with motrin, percocet, oxycodone and PNV.  F/u 6 weeks.     LOS: 2 days   [name], [name] 07/29/2014, 7:36 AM

## 2014-07-29 NOTE — Discharge Summary (Signed)
Obstetric Discharge Summary Reason for Admission: onset of labor and rupture of membranes Prenatal Procedures: none Intrapartum Procedures: spontaneous vaginal delivery Postpartum Procedures: none Complications-Operative and Postpartum: 1st  degree perineal laceration and labial laceration HEMOGLOBIN  Date Value Ref Range Status  07/28/2014 10.3* 12.0 - 15.0 g/dL Final    Comment:    REPEATED TO VERIFY DELTA CHECK NOTED    HCT  Date Value Ref Range Status  07/28/2014 30.8* 36.0 - 46.0 % Final    Physical Exam:  General: alert and no distress Lochia: appropriate Uterine Fundus: firm  Discharge Diagnoses: Term Pregnancy-delivered and h/o incompetent cervix, cerclage placed and removed, h/o vasa previa, h/o placenta marginalis  Discharge Information: Date: 07/29/2014 Activity: pelvic rest Diet: routine Medications: PNV, Ibuprofen, Percocet and Oxycodone Condition: stable Instructions: refer to practice specific booklet Discharge to: home Follow-up Information    Follow up with [name], Augusto GambleJody, MD In 6 weeks.   Specialty:  Obstetrics and Gynecology   Why:  for postpartum check   Contact information:   [address] [place] [address] [phone]       Newborn Data: Live born female  Birth Weight: 7 lb 7.1 oz (3375 g) APGAR: 9, 9  Home with mother.  [name], Roy Snuffer 07/29/2014, 7:44 AM

## 2014-07-29 NOTE — Lactation Note (Signed)
This note was copied from the chart of [name]. Lactation Consultation Note  Mother pumping with her DEBP upon entering the room.  Flange too small and causing swelling on side of nipple. Suggest mother start using larger size 24 flanges.  Mother's nipples are inverted but evert well with pumping. Mother states she plans to attempt to latch once she gets home.  Encouraged her to call while here if she wants assistance w/ latching. Encouraged mother to prepump to evert nipple then attempt latching.  Provided mother w/ comfort gels for soreness. Mother is concerned that is not receiving enough colostrum when pumping.  Provided mother with volume guidelines sheet. She has been pumping approx. 4-5 ml.  Reviewed massaging and hand express before and after pumping. Mother is giving baby both breastmilk and formula. Discussed engorgement care and monitoring voids/stools.    Patient Name: [name] JYNWG'NToday's Date: 07/29/2014 Reason for consult: Follow-up assessment   Maternal Data    Feeding Feeding Type: Breast Milk with Formula added Nipple Type: Slow - flow  LATCH Score/Interventions                      Lactation Tools Discussed/Used     Consult Status Consult Status: Complete    Hardie PulleyBerkelhammer, Ruth Boschen 07/29/2014, 9:18 AM

## 2014-08-06 ENCOUNTER — Inpatient Hospital Stay (HOSPITAL_COMMUNITY): Admission: RE | Admit: 2014-08-06 | Payer: 59 | Source: Ambulatory Visit | Admitting: Obstetrics and Gynecology

## 2014-08-06 SURGERY — Surgical Case
Anesthesia: Regional

## 2014-08-20 ENCOUNTER — Ambulatory Visit (HOSPITAL_COMMUNITY): Admission: RE | Admit: 2014-08-20 | Payer: 59 | Source: Ambulatory Visit

## 2014-08-27 ENCOUNTER — Ambulatory Visit (HOSPITAL_COMMUNITY): Admission: RE | Admit: 2014-08-27 | Payer: 59 | Source: Ambulatory Visit

## 2014-09-02 ENCOUNTER — Ambulatory Visit (HOSPITAL_COMMUNITY): Admission: RE | Admit: 2014-09-02 | Payer: 59 | Source: Ambulatory Visit

## 2014-09-09 ENCOUNTER — Ambulatory Visit (HOSPITAL_COMMUNITY): Payer: 59

## 2014-11-15 IMAGING — CR DG CHEST 1V PORT
1 series · 1 of 1 positions shown · non-contrast
Comparison: 10/10/2013

CLINICAL DATA: PICC line placement

EXAM:
PORTABLE CHEST - 1 VIEW

[view not recorded]
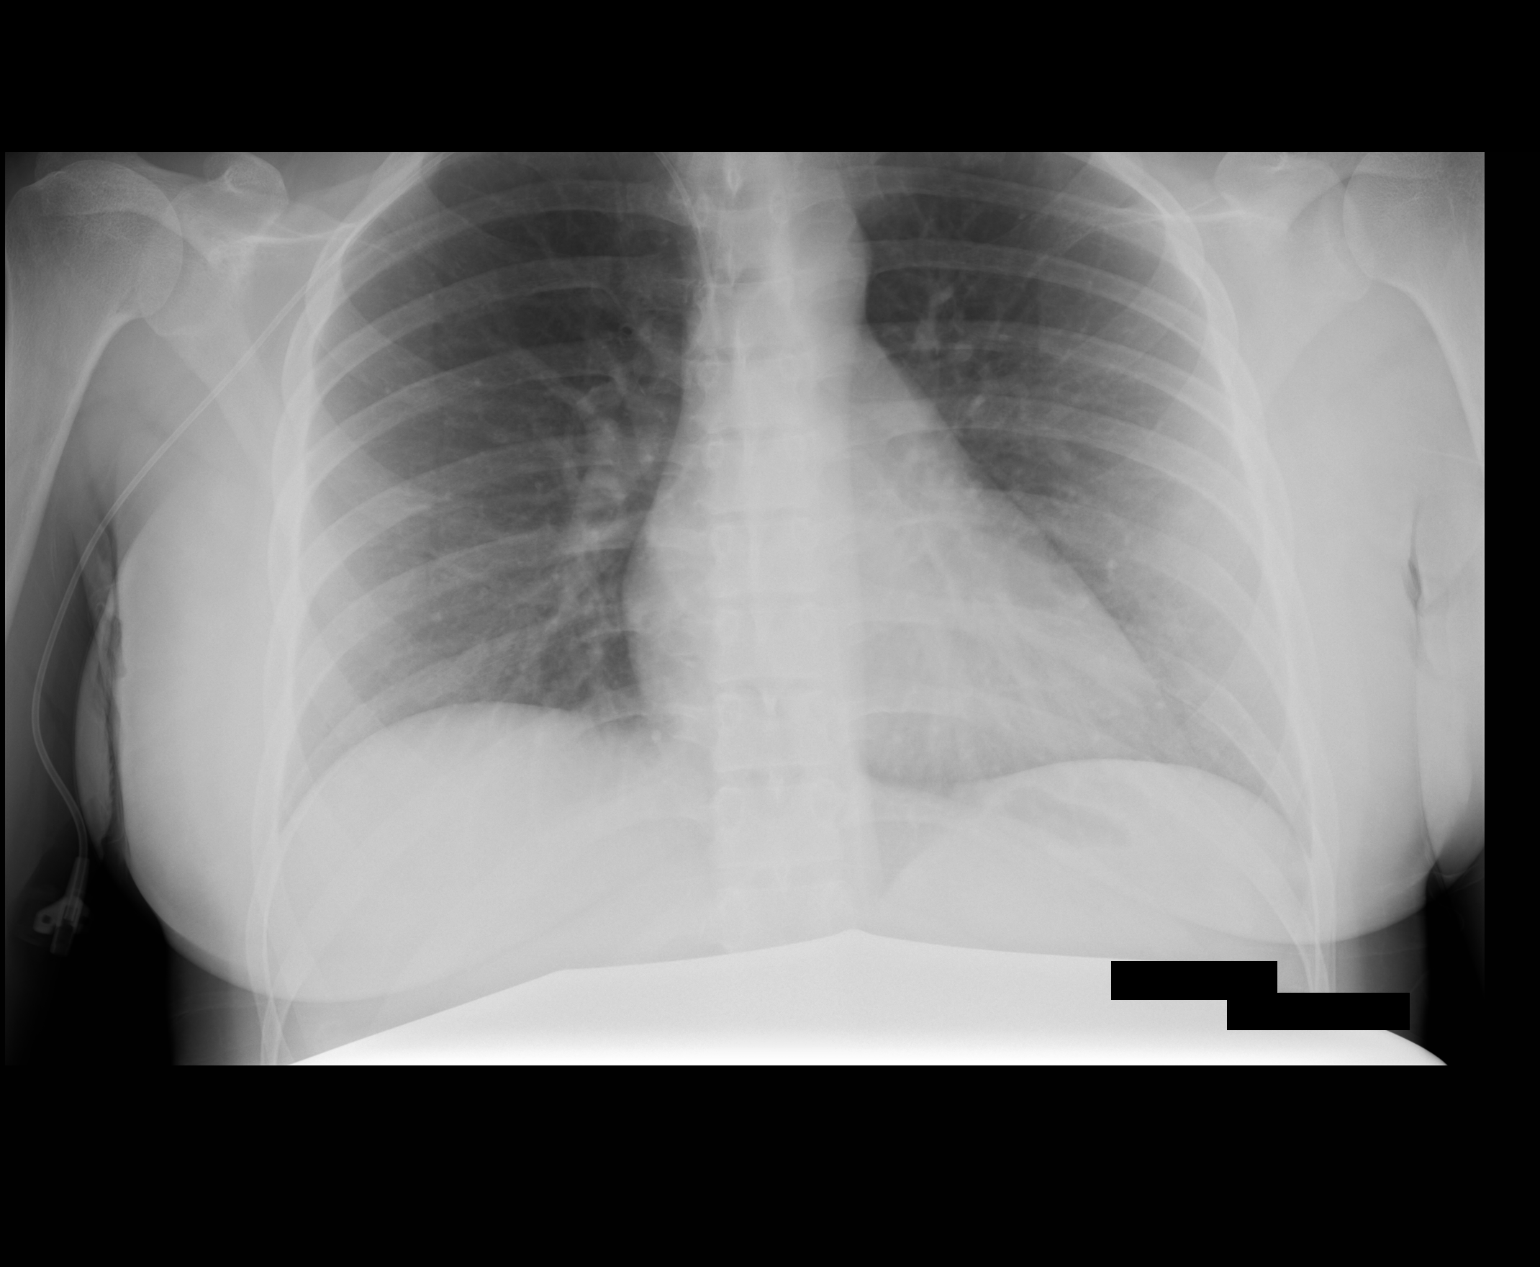

[1 of 1 positions shown; findings below may reference images not displayed]

FINDINGS: The heart size and mediastinal contours are within normal limits.
Both lungs are clear. The visualized skeletal structures are
unremarkable. New right arm PICC line is seen with tip overlying the
mid SVC.
IMPRESSION: New PICC line tip overlies the mid SVC.  No active disease.

## 2015-01-17 IMAGING — US US OB LIMITED
1 series · 13 of 28 positions shown · non-contrast
Comparison: none

[Series 1: us ob limited · 0.27mm/px · 43 acquisitions, 13 frames shown]
[im 2/43]
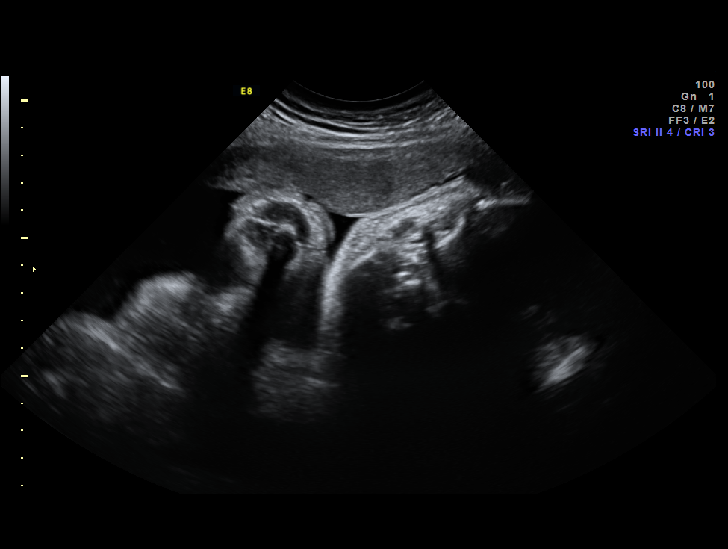
[im 5/43]
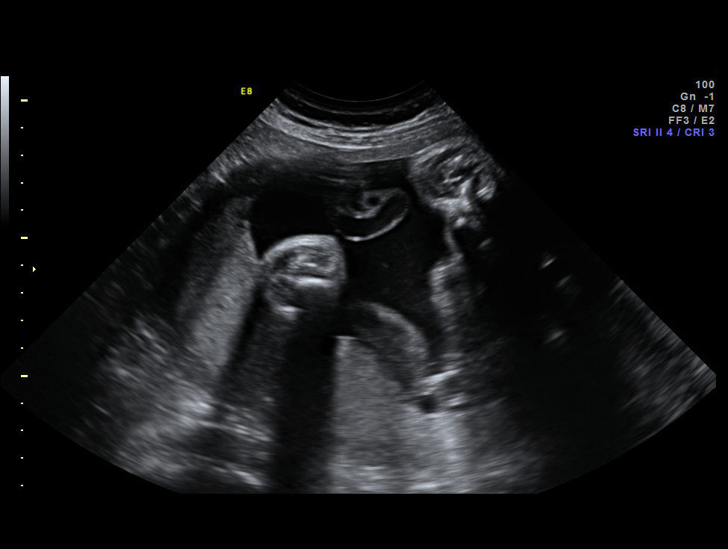
[im 8/43]
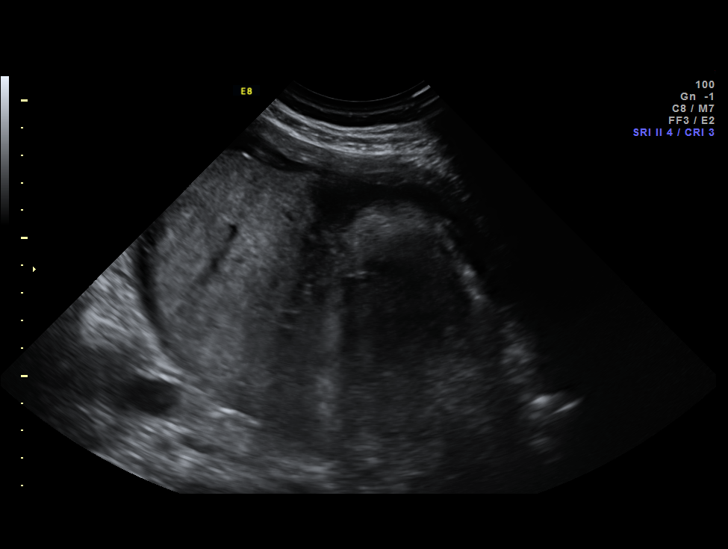
[im 11/43]
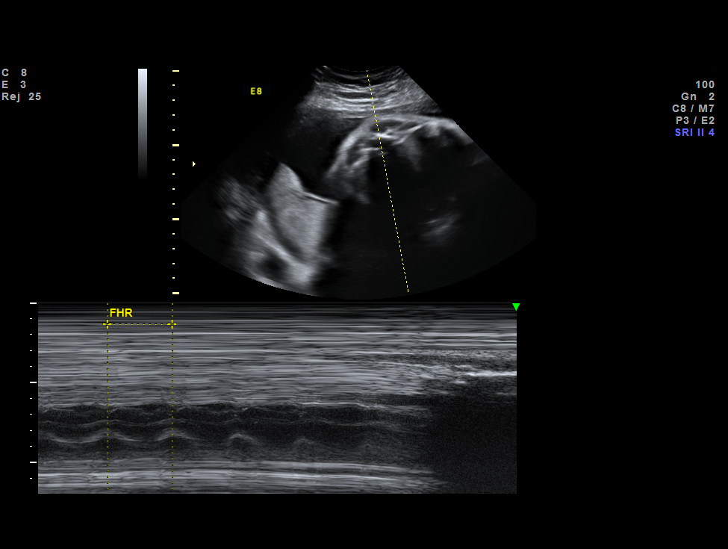
[im 15/43]
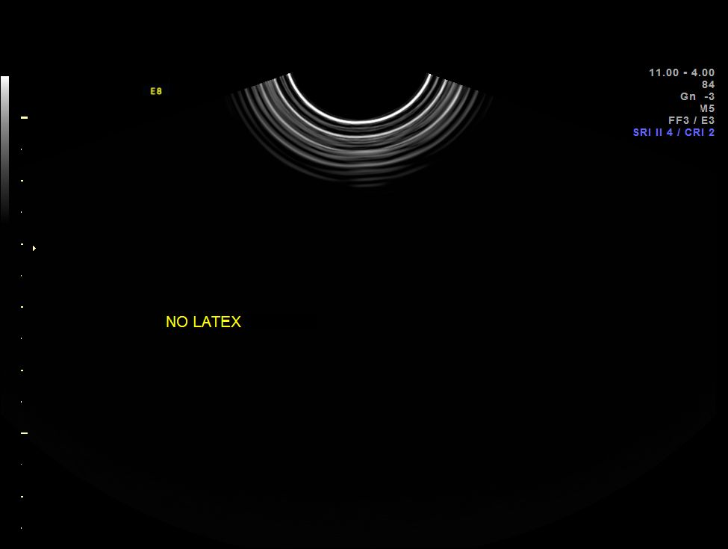
[im 18/43]
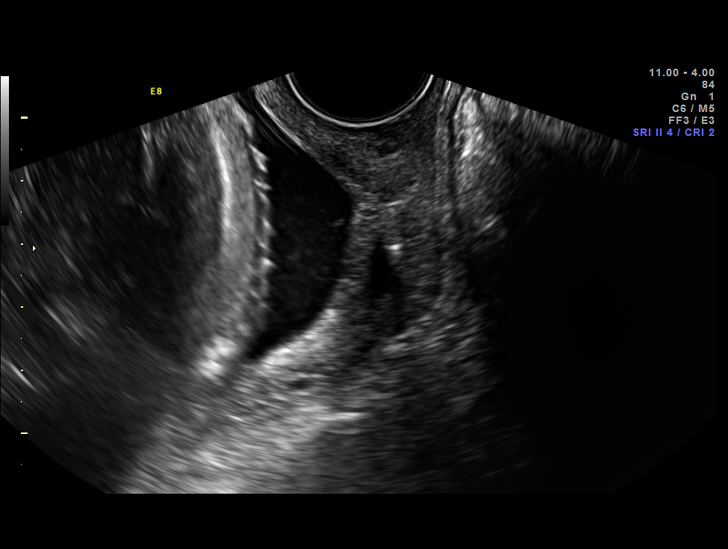
[im 22/43]
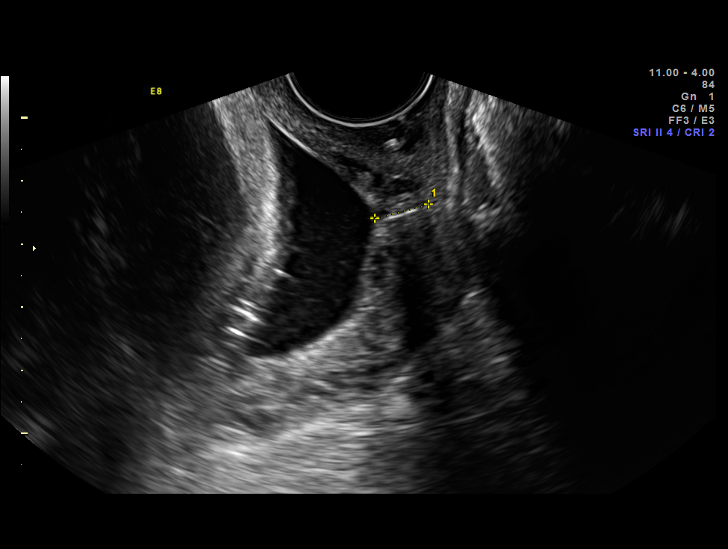
[im 25/43]
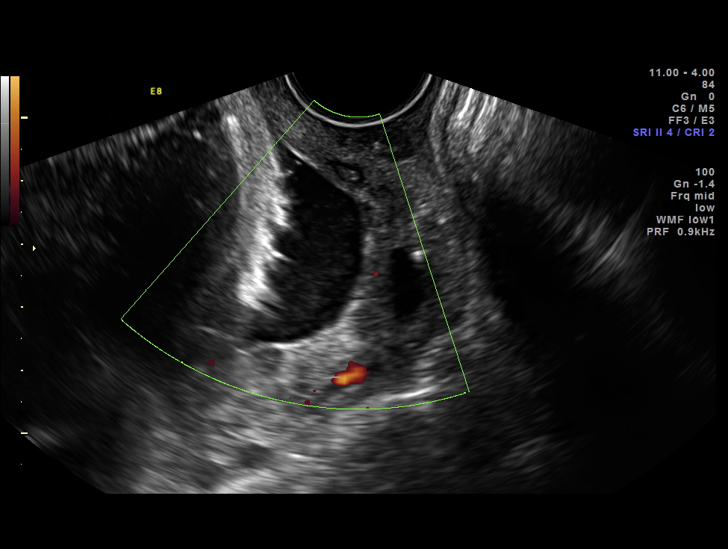
[im 29/43]
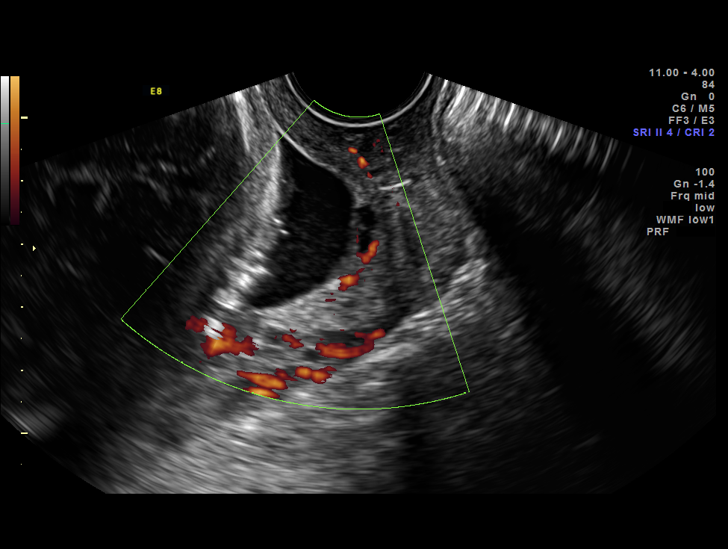
[im 32/43]
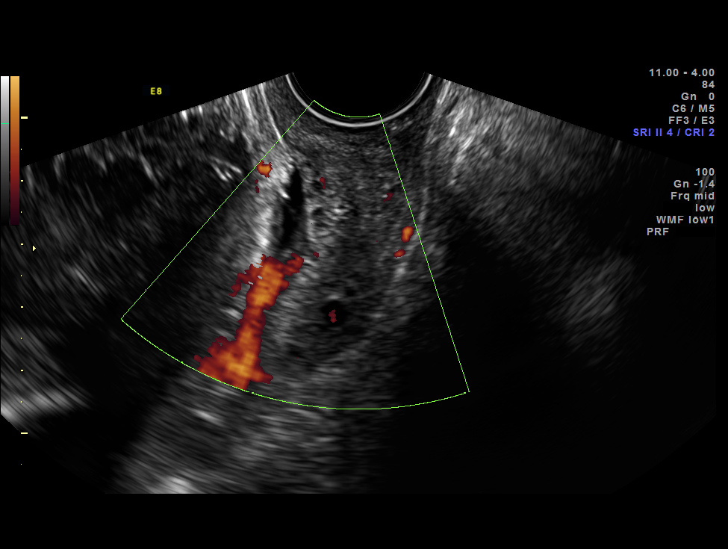
[im 35/43]
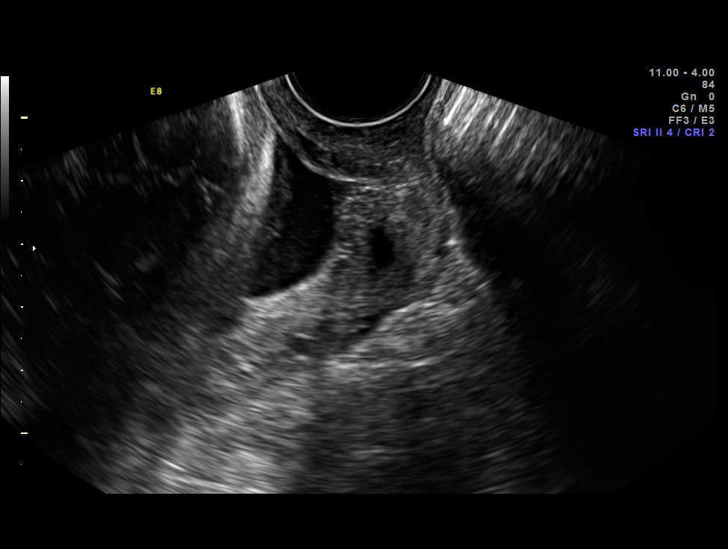
[im 38/43]
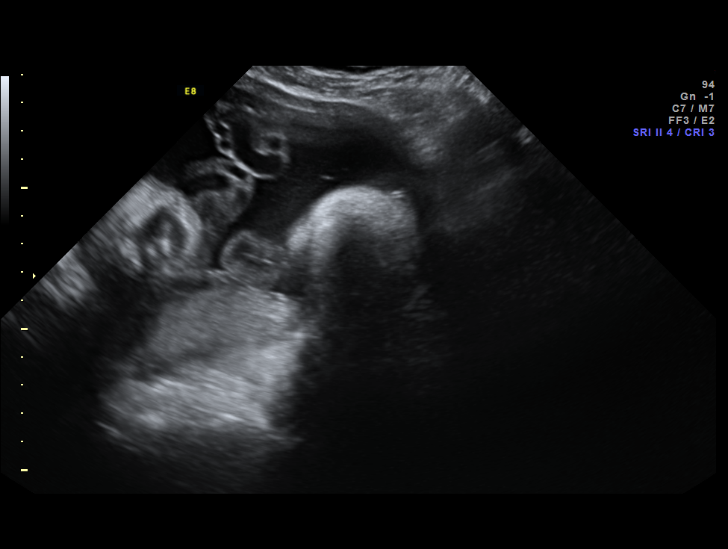
[im 41/43]
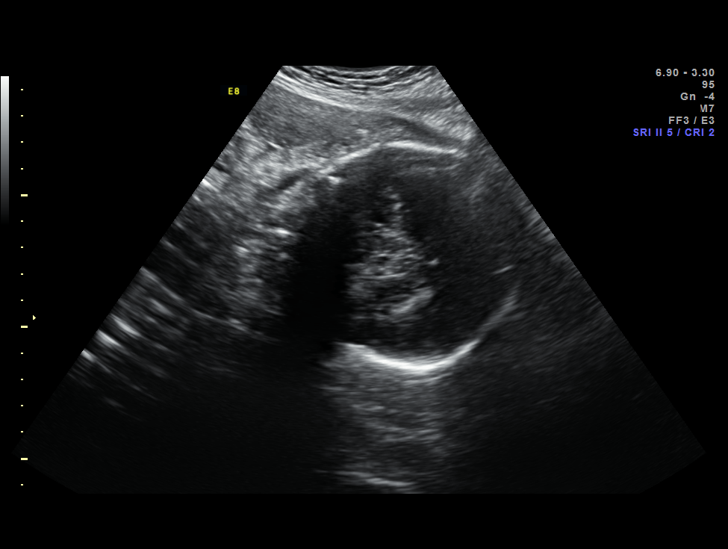

[13 of 28 positions shown; findings below may reference images not displayed]

OBSTETRICS REPORT
                      (Signed Final 06/25/2014 [DATE])

Service(s) Provided

 [HOSPITAL]                                         76815.0
 US MFM OB TRANSVAGINAL                                76817.2
Indications

 Cervical insufficiency,3rd (S/P cerclage 04/03/14)
 Poor obstetric history: Previous preterm delivery
 (24 weeks)
 History of genetic / anatomic abnormality -
 congenital adrenal hypoplasia (previous child)
 Previous cervical surgery (LEEP 5440)
Fetal Evaluation

 Num Of Fetuses:    1
 Fetal Heart Rate:  155                          bpm
 Cardiac Activity:  Observed
 Presentation:      Cephalic
 Placenta:          Succenturiate lobe. NO
                    PREVIA

 Amniotic Fluid
 AFI FV:      Subjectively within normal limits
 AFI Sum:     8       cm        4  %Tile
Gestational Age

 LMP:           34w 2d        Date:  10/28/13                 EDD:   08/04/14
 Best:          33w 0d     Det. By:  Early Ultrasound         EDD:   08/13/14
                                     (12/19/13)
Cervix Uterus Adnexa

 Cervical Length:    1         cm

 Cervix:       Measured transvaginally. Cerclage visualized.
Impression

 Single IUP at 33w 0d for follow up due to prior marginal vasa
 previa and prior marginal placenta previa, cervical
 insufficiency s/p cerclage
 No dysmorphic features
 Criteria for vasa previa or placenta previa (fetal vessels within
 2cm of internal os) are NOT met as both placenta edge and
 fetal vessels in the membranes are more than 3cm from the
 internal os = no previa
 Posterior placenta with an anterior succenturate lobe
 Normal amniotic fluid volume
 TVUS - cervical length 1 cm.  Some V-shaped funneling
 noted.  Cerclage stitch appears intact.
Recommendations

 1. interval growth and cervical length in 1 week.
 2. follow up thereafter as clinically indicated.
 3. recommend cerclage removal at 36 weeks
 4. patient is an excellent candidate for spontaneous labor
 with close attention to vaginal bleeding and fetal heart
 tracing.

 questions or concerns.
